# Patient Record
Sex: Female | Born: 1975 | Race: White | Hispanic: No | Marital: Single | Smoking: Never smoker
Health system: Southern US, Community
[De-identification: ages and names within clinical notes are randomized; demographics above are authoritative.]

## PROBLEM LIST (undated history)

## (undated) DIAGNOSIS — E282 Polycystic ovarian syndrome: Secondary | ICD-10-CM

## (undated) DIAGNOSIS — H832X9 Labyrinthine dysfunction, unspecified ear: Secondary | ICD-10-CM

## (undated) DIAGNOSIS — F32A Depression, unspecified: Secondary | ICD-10-CM

## (undated) DIAGNOSIS — J45909 Unspecified asthma, uncomplicated: Secondary | ICD-10-CM

## (undated) DIAGNOSIS — K219 Gastro-esophageal reflux disease without esophagitis: Secondary | ICD-10-CM

## (undated) DIAGNOSIS — L509 Urticaria, unspecified: Secondary | ICD-10-CM

## (undated) DIAGNOSIS — H8190 Unspecified disorder of vestibular function, unspecified ear: Secondary | ICD-10-CM

## (undated) DIAGNOSIS — D649 Anemia, unspecified: Secondary | ICD-10-CM

## (undated) DIAGNOSIS — F419 Anxiety disorder, unspecified: Secondary | ICD-10-CM

## (undated) DIAGNOSIS — L719 Rosacea, unspecified: Secondary | ICD-10-CM

## (undated) DIAGNOSIS — K8020 Calculus of gallbladder without cholecystitis without obstruction: Secondary | ICD-10-CM

## (undated) DIAGNOSIS — K648 Other hemorrhoids: Secondary | ICD-10-CM

## (undated) DIAGNOSIS — Z800 Family history of malignant neoplasm of digestive organs: Secondary | ICD-10-CM

## (undated) DIAGNOSIS — Z8719 Personal history of other diseases of the digestive system: Secondary | ICD-10-CM

## (undated) HISTORY — PX: UPPER GASTROINTESTINAL ENDOSCOPY: SHX188

## (undated) HISTORY — DX: Personal history of other diseases of the digestive system: Z87.19

## (undated) HISTORY — PX: COLONOSCOPY: SHX174

## (undated) HISTORY — PX: APPENDECTOMY: SHX54

## (undated) HISTORY — DX: Rosacea, unspecified: L71.9

## (undated) HISTORY — DX: Unspecified asthma, uncomplicated: J45.909

## (undated) HISTORY — DX: Unspecified disorder of vestibular function, unspecified ear: H81.90

## (undated) HISTORY — DX: Labyrinthine dysfunction, unspecified ear: H83.2X9

## (undated) HISTORY — PX: ESOPHAGOGASTRODUODENOSCOPY: SHX1529

## (undated) HISTORY — DX: Urticaria, unspecified: L50.9

## (undated) HISTORY — DX: Family history of malignant neoplasm of digestive organs: Z80.0

## (undated) HISTORY — DX: Other hemorrhoids: K64.8

## (undated) HISTORY — DX: Polycystic ovarian syndrome: E28.2

## (undated) HISTORY — DX: Gastro-esophageal reflux disease without esophagitis: K21.9

## (undated) HISTORY — PX: OTHER SURGICAL HISTORY: SHX169

## (undated) HISTORY — DX: Calculus of gallbladder without cholecystitis without obstruction: K80.20

---

## 1999-07-27 ENCOUNTER — Other Ambulatory Visit: Admission: RE | Admit: 1999-07-27 | Discharge: 1999-07-27 | Payer: Self-pay | Admitting: Obstetrics & Gynecology

## 2000-09-02 ENCOUNTER — Other Ambulatory Visit: Admission: RE | Admit: 2000-09-02 | Discharge: 2000-09-02 | Payer: Self-pay | Admitting: Obstetrics & Gynecology

## 2001-12-04 ENCOUNTER — Other Ambulatory Visit: Admission: RE | Admit: 2001-12-04 | Discharge: 2001-12-04 | Payer: Self-pay | Admitting: Obstetrics & Gynecology

## 2002-12-08 ENCOUNTER — Other Ambulatory Visit: Admission: RE | Admit: 2002-12-08 | Discharge: 2002-12-08 | Payer: Self-pay | Admitting: Gynecology

## 2003-01-18 ENCOUNTER — Emergency Department (HOSPITAL_COMMUNITY): Admission: EM | Admit: 2003-01-18 | Discharge: 2003-01-18 | Payer: Self-pay | Admitting: Emergency Medicine

## 2003-01-18 ENCOUNTER — Encounter: Payer: Self-pay | Admitting: Emergency Medicine

## 2003-12-03 ENCOUNTER — Ambulatory Visit (HOSPITAL_COMMUNITY): Admission: RE | Admit: 2003-12-03 | Discharge: 2003-12-03 | Payer: Self-pay | Admitting: Internal Medicine

## 2003-12-14 ENCOUNTER — Emergency Department (HOSPITAL_COMMUNITY): Admission: EM | Admit: 2003-12-14 | Discharge: 2003-12-15 | Payer: Self-pay | Admitting: Emergency Medicine

## 2003-12-20 ENCOUNTER — Encounter: Admission: RE | Admit: 2003-12-20 | Discharge: 2003-12-20 | Payer: Self-pay | Admitting: Internal Medicine

## 2003-12-31 ENCOUNTER — Other Ambulatory Visit: Admission: RE | Admit: 2003-12-31 | Discharge: 2003-12-31 | Payer: Self-pay | Admitting: Gynecology

## 2004-01-20 ENCOUNTER — Encounter (INDEPENDENT_AMBULATORY_CARE_PROVIDER_SITE_OTHER): Payer: Self-pay | Admitting: Gastroenterology

## 2004-01-20 DIAGNOSIS — K209 Esophagitis, unspecified without bleeding: Secondary | ICD-10-CM | POA: Insufficient documentation

## 2004-01-31 ENCOUNTER — Emergency Department (HOSPITAL_COMMUNITY): Admission: EM | Admit: 2004-01-31 | Discharge: 2004-01-31 | Payer: Self-pay | Admitting: Emergency Medicine

## 2004-12-27 ENCOUNTER — Other Ambulatory Visit: Admission: RE | Admit: 2004-12-27 | Discharge: 2004-12-27 | Payer: Self-pay | Admitting: Gynecology

## 2005-10-11 ENCOUNTER — Ambulatory Visit: Payer: Self-pay | Admitting: Family Medicine

## 2006-02-14 ENCOUNTER — Other Ambulatory Visit: Admission: RE | Admit: 2006-02-14 | Discharge: 2006-02-14 | Payer: Self-pay | Admitting: Gynecology

## 2006-05-29 ENCOUNTER — Ambulatory Visit: Payer: Self-pay | Admitting: Gastroenterology

## 2006-11-18 ENCOUNTER — Ambulatory Visit: Payer: Self-pay | Admitting: Family Medicine

## 2006-11-19 LAB — CONVERTED CEMR LAB
ALT: 8 units/L (ref 0–40)
AST: 17 units/L (ref 0–37)
Albumin: 4.1 g/dL (ref 3.5–5.2)
Alkaline Phosphatase: 64 units/L (ref 39–117)
Amylase: 63 units/L (ref 27–131)
BUN: 14 mg/dL (ref 6–23)
Basophils Absolute: 0.1 10*3/uL (ref 0.0–0.1)
Basophils Relative: 0.9 % (ref 0.0–1.0)
CO2: 30 meq/L (ref 19–32)
Calcium: 9.7 mg/dL (ref 8.4–10.5)
Chloride: 103 meq/L (ref 96–112)
Creatinine, Ser: 0.6 mg/dL (ref 0.4–1.2)
Eosinophils Relative: 6.2 % — ABNORMAL HIGH (ref 0.0–5.0)
GFR calc Af Amer: 151 mL/min
GFR calc non Af Amer: 125 mL/min
Glucose, Bld: 91 mg/dL (ref 70–99)
HCT: 38.8 % (ref 36.0–46.0)
Hemoglobin: 13 g/dL (ref 12.0–15.0)
Lymphocytes Relative: 25.1 % (ref 12.0–46.0)
MCHC: 33.6 g/dL (ref 30.0–36.0)
MCV: 89.4 fL (ref 78.0–100.0)
Monocytes Absolute: 0.5 10*3/uL (ref 0.2–0.7)
Monocytes Relative: 5.9 % (ref 3.0–11.0)
Neutro Abs: 5.4 10*3/uL (ref 1.4–7.7)
Neutrophils Relative %: 61.9 % (ref 43.0–77.0)
Platelets: 385 10*3/uL (ref 150–400)
Potassium: 3.8 meq/L (ref 3.5–5.1)
RBC: 4.33 M/uL (ref 3.87–5.11)
RDW: 12.2 % (ref 11.5–14.6)
Sodium: 139 meq/L (ref 135–145)
TSH: 1.45 microintl units/mL (ref 0.35–5.50)
Total Bilirubin: 0.9 mg/dL (ref 0.3–1.2)
Total Protein: 7 g/dL (ref 6.0–8.3)
WBC: 8.7 10*3/uL (ref 4.5–10.5)

## 2007-02-27 ENCOUNTER — Other Ambulatory Visit: Admission: RE | Admit: 2007-02-27 | Discharge: 2007-02-27 | Payer: Self-pay | Admitting: Gynecology

## 2007-04-03 ENCOUNTER — Ambulatory Visit: Payer: Self-pay | Admitting: Internal Medicine

## 2007-05-15 ENCOUNTER — Ambulatory Visit: Payer: Self-pay | Admitting: Internal Medicine

## 2007-05-15 ENCOUNTER — Encounter: Payer: Self-pay | Admitting: Internal Medicine

## 2007-05-15 DIAGNOSIS — K648 Other hemorrhoids: Secondary | ICD-10-CM | POA: Insufficient documentation

## 2007-10-08 ENCOUNTER — Ambulatory Visit: Payer: Self-pay | Admitting: Family Medicine

## 2007-10-08 DIAGNOSIS — J018 Other acute sinusitis: Secondary | ICD-10-CM | POA: Insufficient documentation

## 2007-12-09 ENCOUNTER — Ambulatory Visit: Payer: Self-pay | Admitting: Family Medicine

## 2007-12-09 DIAGNOSIS — J069 Acute upper respiratory infection, unspecified: Secondary | ICD-10-CM | POA: Insufficient documentation

## 2007-12-12 ENCOUNTER — Telehealth: Payer: Self-pay | Admitting: Family Medicine

## 2008-02-05 ENCOUNTER — Telehealth: Payer: Self-pay | Admitting: Family Medicine

## 2008-03-08 ENCOUNTER — Other Ambulatory Visit: Admission: RE | Admit: 2008-03-08 | Discharge: 2008-03-08 | Payer: Self-pay | Admitting: Gynecology

## 2008-04-14 DIAGNOSIS — Z8719 Personal history of other diseases of the digestive system: Secondary | ICD-10-CM | POA: Insufficient documentation

## 2008-04-14 DIAGNOSIS — K219 Gastro-esophageal reflux disease without esophagitis: Secondary | ICD-10-CM | POA: Insufficient documentation

## 2008-04-14 DIAGNOSIS — E282 Polycystic ovarian syndrome: Secondary | ICD-10-CM | POA: Insufficient documentation

## 2008-11-04 ENCOUNTER — Telehealth: Payer: Self-pay | Admitting: Family Medicine

## 2008-12-13 ENCOUNTER — Ambulatory Visit: Payer: Self-pay | Admitting: Family Medicine

## 2008-12-13 DIAGNOSIS — R42 Dizziness and giddiness: Secondary | ICD-10-CM | POA: Insufficient documentation

## 2008-12-15 ENCOUNTER — Telehealth: Payer: Self-pay | Admitting: Family Medicine

## 2009-01-25 ENCOUNTER — Encounter: Payer: Self-pay | Admitting: Family Medicine

## 2009-06-13 ENCOUNTER — Ambulatory Visit: Payer: Self-pay | Admitting: Family Medicine

## 2009-06-13 DIAGNOSIS — J309 Allergic rhinitis, unspecified: Secondary | ICD-10-CM | POA: Insufficient documentation

## 2010-06-30 ENCOUNTER — Ambulatory Visit: Admission: RE | Admit: 2010-06-30 | Discharge: 2010-06-30 | Payer: Self-pay | Admitting: Gynecology

## 2010-06-30 ENCOUNTER — Ambulatory Visit: Payer: Self-pay | Admitting: Vascular Surgery

## 2011-03-20 NOTE — Assessment & Plan Note (Signed)
[facility]                         GASTROENTEROLOGY OFFICE NOTE   Maria Duran, Maria Duran                      MRN:          [id number]  DATE:04/03/2007                            DOB:          [birth year]    CHIEF COMPLAINT:  Discuss colonoscopy.   HISTORY:  Maria Duran is a 35 year old white woman previously followed by Dr.  Victorino Duran. Her problems are:  1. Gastroesophageal reflux disease.  2. Irritable bowel syndrome with history of small ulcer in terminal      ileum on previous colonoscopy in 1998. Inflammatory bowel disease      serology and biopsies did not indicate inflammatory bowel disease.  3. Polycystic ovary syndrome.  4. Family history of colon cancer in a paternal grandfather in his 68s      and history of polyps in her father in his 16s.   INTERVAL HISTORY:  Around March or April she had a spell with some  diarrhea, cramping abdominal pain and she also had trouble with blood in  her stool. That seems to have resolved. As far as social history is  concerned, some time around that time, her husband told her that he did  not love her anymore and wanted a divorce. She seems to be dealing with  that reasonably well she tells me and does not think that necessarily  triggered this. Right now, she seems to be doing reasonably well, but  she is concerned about the passage of blood and the family history.   MEDICATIONS:  1. Aciphex 20 mg daily.  2. Zoloft 50 mg daily.   DRUG ALLERGIES:  SHE GOT A RASH WITH ERYTHROMYCIN.   PHYSICAL EXAMINATION:  Shows her to be in no acute distress. This is a  well-developed, well-nourished, overweight young white woman. Height is  5 feet, 6 inches, weight 189 pounds, pulse 64, blood pressure 116/70.  LUNGS:  Clear.  HEART: S1, S2. No murmurs, rubs or gallops.  ABDOMEN: Is soft. Nontender without organomegaly or mass.   ASSESSMENT:  History of blood in the stool, some change in her bowel  habits recently,  though she may certainly just have irritable bowel  syndrome and anorectal bleeding. There is a family history of colon  cancer and polyps so colorectal neoplasia and inflammatory bowel disease  are considerations given the overall history.   PLAN:  1. Schedule colonoscopy. If this is unrevealing,as far as her family      history issues will need to check into that, but it may be that she      should wait another ten years until she is about 40 to      have another colonoscopy I think. There are no other family members      with colon polyps or      cancer.  2. Further medication plans pending clinical course and these results.     Iva Boop, MD,FACG  Electronically Signed    CEG/MedQ  DD: 04/03/2007  DT: 04/03/2007  Job #: [id number]

## 2011-03-23 NOTE — Assessment & Plan Note (Signed)
Maria Duran                           GASTROENTEROLOGY OFFICE NOTE   Maria Duran, Maria Duran                      MRN:          [id number]  DATE:05/29/2006                            DOB:          01-25-1976    PROBLEM:  Chest burning.   HISTORY:  Maria Duran is a pleasant 35 year old white female known to Dr. Victorino Dike, who has not been seen in approximately two years.  She had been  seen in the past for reflux symptoms and also has family history of colon  cancer.  She had undergone prior endoscopy in 2005 which showed a 2 cm  hiatal hernia and mild distal esophagitis.  There was some difficulty  handling air insufflation noted.  She also had prior colonoscopy done in  1998.  There was one small ulcer in the terminal ileum noted. Question or  rule out IBD.  Her biopsies showed mucosal erosion with acute inflammation,  non-specific.  Patient had been on PPI therapy in the past but says she has  not been on any medication over the past two years.  She has noticed over  the past couple of months that she has had recurrent symptoms with  intermittent burning in her chest.  She also complains of a lot of burping  and belching and a frequent need to clear her throat.  She has been using  Rolaids p.r.n. which she does find helpful, but does not control her  symptoms.  She denies any nocturnal symptoms currently.  Says that her chest  discomfort is worse postprandially.  She has not had any vomiting, denies  any abdominal pain.  Her bowel habits have been normal.   She denies any problems with abdominal cramping, diarrhea or rectal  bleeding.   CURRENT MEDICATIONS:  Zoloft 50 q. day.   ALLERGIES:  ERYTHROMYCIN which causes a rash.   FAMILY HISTORY:  Pertinent for paternal grandfather with colon cancer  diagnosed in his late 15s.  Father with history of colon polyps, initially  diagnosed in his 82s.  She has a maternal grandfather with history of  Crohn's.   EXAMINATION:  GENERAL:  Well developed white female in no acute distress.  VITAL SIGNS:  Blood pressure 118/64.  Pulse is 60.  Weight is 188.4.  CARDIOVASCULAR:  Regular rate and rhythm with S1 and S2.  PULMONARY:  Clear to auscultation and percussion.  ABDOMEN:  Soft, bowel sounds are active.  She is non-tender.   IMPRESSION:  1.  GERD with recurrent symptoms.  2.  Positive family history of colon cancer in a grandfather less than 31      years of age.   PLAN:  1.  Start Nexium 40 mg q. a.m.  2.  Schedule colonoscopy due to family history at her convenience.                                   Maria Esterwood, PA-C  Ulyess Mort, MD   AE/MedQ  DD:  05/31/2006  DT:  05/31/2006  Job #:  [id number]

## 2011-04-03 ENCOUNTER — Encounter: Payer: Self-pay | Admitting: *Deleted

## 2011-04-03 ENCOUNTER — Encounter: Payer: Self-pay | Admitting: Family Medicine

## 2011-04-03 ENCOUNTER — Ambulatory Visit (INDEPENDENT_AMBULATORY_CARE_PROVIDER_SITE_OTHER): Payer: 59 | Admitting: Family Medicine

## 2011-04-03 VITALS — BP 104/76 | HR 73 | Temp 98.7°F | Resp 14 | Wt 157.0 lb

## 2011-04-03 DIAGNOSIS — R600 Localized edema: Secondary | ICD-10-CM

## 2011-04-03 DIAGNOSIS — R609 Edema, unspecified: Secondary | ICD-10-CM

## 2011-04-03 DIAGNOSIS — E785 Hyperlipidemia, unspecified: Secondary | ICD-10-CM

## 2011-04-03 LAB — BASIC METABOLIC PANEL
Calcium: 9.4 mg/dL (ref 8.4–10.5)
GFR: 99.38 mL/min (ref >60.00)
Potassium: 4.6 mEq/L (ref 3.5–5.1)
Sodium: 142 mEq/L (ref 135–145)

## 2011-04-03 LAB — CBC WITH DIFFERENTIAL/PLATELET
Basophils Absolute: 0.1 10*3/uL (ref 0.0–0.1)
Eosinophils Relative: 6.5 % — ABNORMAL HIGH (ref 0.0–5.0)
HCT: 36.3 % (ref 36.0–46.0)
Lymphs Abs: 1.7 10*3/uL (ref 0.7–4.0)
Monocytes Absolute: 0.5 10*3/uL (ref 0.1–1.0)
Monocytes Relative: 7 % (ref 3.0–12.0)
Neutrophils Relative %: 60.7 % (ref 43.0–77.0)
Platelets: 294 10*3/uL (ref 150.0–400.0)
RDW: 12.6 % (ref 11.5–14.6)
WBC: 6.8 10*3/uL (ref 4.5–10.5)

## 2011-04-03 LAB — LIPID PANEL
Cholesterol: 186 mg/dL (ref 0–200)
Triglycerides: 65 mg/dL (ref 0.0–149.0)

## 2011-04-03 LAB — HEPATIC FUNCTION PANEL
ALT: 7 U/L (ref 0–35)
AST: 14 U/L (ref 0–37)
Total Bilirubin: 0.7 mg/dL (ref 0.3–1.2)
Total Protein: 6.7 g/dL (ref 6.0–8.3)

## 2011-04-03 LAB — TSH: TSH: 1.02 u[IU]/mL (ref 0.35–5.50)

## 2011-04-03 MED ORDER — LORAZEPAM 0.5 MG PO TABS: 0.5000 mg | ORAL_TABLET | Freq: Four times a day (QID) | ORAL | Status: AC | PRN

## 2011-04-03 NOTE — Progress Notes (Signed)
Addended by: Bonnye Fava on: 04/03/2011 09:20 AM   Modules accepted: Orders

## 2011-04-03 NOTE — Progress Notes (Signed)
  Subjective:    Patient ID: Maria Duran, female    DOB: 30-May-1976, 35 y.o.   MRN: [id number]  HPI Here for one year of unilateral swelling in the left leg from the foot up to the thigh. This is intermittent, and it gets worse during the day and goes down at night. No swelling in the right leg or hands. No SOB. The leg may feel tight but is not painful. She was started on BCP about a year ago by Dr. Chevis Pretty, and this started a month or 2 after that. Dr. Chevis Pretty ordered a left leg venous doppler on 06-30-10, which was normal.    Review of Systems  Constitutional: Negative.   Respiratory: Negative.   Cardiovascular: Positive for leg swelling. Negative for chest pain and palpitations.  Gastrointestinal: Negative.        Objective:   Physical Exam  Constitutional: She appears well-developed and well-nourished. No distress.  Neck: No thyromegaly present.  Cardiovascular: Normal rate, regular rhythm, normal heart sounds and intact distal pulses.   Pulmonary/Chest: Effort normal and breath sounds normal.  Abdominal: Soft. Bowel sounds are normal. She exhibits no distension and no mass. There is no tenderness. There is no rebound and no guarding.       No inguinal adenopathy   Musculoskeletal: Normal range of motion. She exhibits edema. She exhibits no tenderness.       She has 2+ edema in the left foot up to the mid shin. No cords are felt, no tenderness, negative Homans  Lymphadenopathy:    She has no cervical adenopathy.  Skin: Skin is warm and dry. No erythema.          Assessment & Plan:  Unilateral leg swelling. We need to rule out a DVT again but also look for any other source of venous blockade. Get a doppler and also a CT of the pelvis to rule out extrinsic sources of pelvic vein obstructions. Walk frequently, elevate the leg.

## 2011-04-04 NOTE — Progress Notes (Signed)
Quick Note:  LMTCB ______ 

## 2011-04-04 NOTE — Progress Notes (Signed)
Quick Note:  Pt given normal results. ______ 

## 2011-04-09 ENCOUNTER — Other Ambulatory Visit: Payer: Self-pay | Admitting: Cardiology

## 2011-04-09 DIAGNOSIS — R609 Edema, unspecified: Secondary | ICD-10-CM

## 2011-04-10 ENCOUNTER — Other Ambulatory Visit: Payer: Self-pay | Admitting: *Deleted

## 2011-04-10 ENCOUNTER — Ambulatory Visit (INDEPENDENT_AMBULATORY_CARE_PROVIDER_SITE_OTHER)
Admission: RE | Admit: 2011-04-10 | Discharge: 2011-04-10 | Disposition: A | Discharge status: Home / Self Care | Payer: 59 | Source: Ambulatory Visit | Attending: Family Medicine | Admitting: Family Medicine

## 2011-04-10 ENCOUNTER — Telehealth: Payer: Self-pay | Admitting: *Deleted

## 2011-04-10 ENCOUNTER — Encounter (INDEPENDENT_AMBULATORY_CARE_PROVIDER_SITE_OTHER): Payer: 59 | Admitting: *Deleted

## 2011-04-10 DIAGNOSIS — R600 Localized edema: Secondary | ICD-10-CM

## 2011-04-10 DIAGNOSIS — R609 Edema, unspecified: Secondary | ICD-10-CM

## 2011-04-10 MED ORDER — IOHEXOL 300 MG/ML  SOLN
100.0000 mL | Freq: Once | INTRAMUSCULAR | Status: AC | PRN
  Administered 2011-04-10: 100 mL via INTRAVENOUS

## 2011-04-10 NOTE — Telephone Encounter (Signed)
Pt aware.

## 2011-04-10 NOTE — Telephone Encounter (Signed)
Maxwell Caul from the Vascular Lab called to inform you that the Venous Lower Leg that was ordered for Maria Duran showed a small, superficial clot at about 4cm above the ankle that is not fresh in origin; appears to have been there awhile; cannot see any further on scan.

## 2011-04-10 NOTE — Telephone Encounter (Signed)
Tell her that the [place] was clear except for a tiny old clot in a superficial vein. This is not dangerous and has nothing to do with the swelling in her leg. We await the CT results

## 2011-04-13 ENCOUNTER — Encounter: Payer: Self-pay | Admitting: Family Medicine

## 2011-06-26 ENCOUNTER — Telehealth: Payer: Self-pay | Admitting: *Deleted

## 2011-06-26 NOTE — Telephone Encounter (Signed)
Requesting copy of doppler and ct--her number when ready  [phone]

## 2011-06-28 NOTE — Telephone Encounter (Signed)
I spoke with pt and will put a copy of CT in mail today. I do not have a copy of the doppler.

## 2011-07-27 ENCOUNTER — Other Ambulatory Visit: Payer: Self-pay | Admitting: Gynecology

## 2012-07-15 ENCOUNTER — Other Ambulatory Visit (INDEPENDENT_AMBULATORY_CARE_PROVIDER_SITE_OTHER): Payer: 59

## 2012-07-15 DIAGNOSIS — Z0000 Encounter for general adult medical examination without abnormal findings: Secondary | ICD-10-CM

## 2012-07-15 LAB — CBC WITH DIFFERENTIAL/PLATELET
Eosinophils Relative: 6 % — ABNORMAL HIGH (ref 0.0–5.0)
HCT: 39.8 % (ref 36.0–46.0)
Hemoglobin: 13.4 g/dL (ref 12.0–15.0)
Lymphs Abs: 1.9 10*3/uL (ref 0.7–4.0)
Monocytes Relative: 7.1 % (ref 3.0–12.0)
Neutro Abs: 3.8 10*3/uL (ref 1.4–7.7)
RBC: 4.42 Mil/uL (ref 3.87–5.11)
WBC: 6.6 10*3/uL (ref 4.5–10.5)

## 2012-07-15 LAB — HEPATIC FUNCTION PANEL
ALT: 17 U/L (ref 0–35)
AST: 18 U/L (ref 0–37)
Albumin: 4 g/dL (ref 3.5–5.2)
Alkaline Phosphatase: 46 U/L (ref 39–117)

## 2012-07-15 LAB — LIPID PANEL
HDL: 57.8 mg/dL (ref >39.00)
LDL Cholesterol: 127 mg/dL — ABNORMAL HIGH (ref 0–99)

## 2012-07-15 LAB — BASIC METABOLIC PANEL
GFR: 138.26 mL/min (ref >60.00)
Glucose, Bld: 85 mg/dL (ref 70–99)
Potassium: 4.3 mEq/L (ref 3.5–5.1)
Sodium: 140 mEq/L (ref 135–145)

## 2012-07-15 LAB — POCT URINALYSIS DIPSTICK
Bilirubin, UA: NEGATIVE
Blood, UA: NEGATIVE
Nitrite, UA: NEGATIVE
Spec Grav, UA: 1.025
Urobilinogen, UA: 0.2
pH, UA: 6

## 2012-07-15 LAB — TSH: TSH: 0.79 u[IU]/mL (ref 0.35–5.50)

## 2012-07-18 NOTE — Progress Notes (Signed)
Quick Note:  I left voice message with results. ______ 

## 2012-07-21 ENCOUNTER — Ambulatory Visit (INDEPENDENT_AMBULATORY_CARE_PROVIDER_SITE_OTHER): Payer: 59 | Admitting: Family Medicine

## 2012-07-21 ENCOUNTER — Encounter: Payer: Self-pay | Admitting: Family Medicine

## 2012-07-21 VITALS — BP 106/68 | HR 71 | Temp 98.8°F | Ht 66.25 in | Wt 168.0 lb

## 2012-07-21 DIAGNOSIS — I8390 Asymptomatic varicose veins of unspecified lower extremity: Secondary | ICD-10-CM

## 2012-07-21 DIAGNOSIS — Z0000 Encounter for general adult medical examination without abnormal findings: Secondary | ICD-10-CM

## 2012-07-21 NOTE — Progress Notes (Signed)
  Subjective:    Patient ID: Maria Duran, female    DOB: 09/24/1976, 36 y.o.   MRN: [id number]  HPI 36 yr old female for a cpx. She feels well except for chronic pain and swelling in the legs. She has known venous disease but has never seen a vein specialist. She asks to do so now.    Review of Systems  Constitutional: Negative.   HENT: Negative.   Eyes: Negative.   Respiratory: Negative.   Cardiovascular: Negative.   Gastrointestinal: Negative.   Genitourinary: Negative for dysuria, urgency, frequency, hematuria, flank pain, decreased urine volume, enuresis, difficulty urinating, pelvic pain and dyspareunia.  Musculoskeletal: Negative.   Skin: Negative.   Neurological: Negative.   Hematological: Negative.   Psychiatric/Behavioral: Negative.        Objective:   Physical Exam  Constitutional: She is oriented to person, place, and time. She appears well-developed and well-nourished. No distress.  HENT:  Head: Normocephalic and atraumatic.  Right Ear: External ear normal.  Left Ear: External ear normal.  Nose: Nose normal.  Mouth/Throat: Oropharynx is clear and moist. No oropharyngeal exudate.  Eyes: Conjunctivae normal and EOM are normal. Pupils are equal, round, and reactive to light. No scleral icterus.  Neck: Normal range of motion. Neck supple. No JVD present. No thyromegaly present.  Cardiovascular: Normal rate, regular rhythm, normal heart sounds and intact distal pulses.  Exam reveals no gallop and no friction rub.   No murmur heard. Pulmonary/Chest: Effort normal and breath sounds normal. No respiratory distress. She has no wheezes. She has no rales. She exhibits no tenderness.  Abdominal: Soft. Bowel sounds are normal. She exhibits no distension and no mass. There is no tenderness. There is no rebound and no guarding.  Musculoskeletal: Normal range of motion. She exhibits no edema and no tenderness.  Lymphadenopathy:    She has no cervical adenopathy.  Neurological:  She is alert and oriented to person, place, and time. She has normal reflexes. No cranial nerve deficit. She exhibits normal muscle tone. Coordination normal.  Skin: Skin is warm and dry. No rash noted. No erythema.  Psychiatric: She has a normal mood and affect. Her behavior is normal. Judgment and thought content normal.          Assessment & Plan:  Well exam. Refer to the [facility].

## 2012-09-24 ENCOUNTER — Ambulatory Visit (INDEPENDENT_AMBULATORY_CARE_PROVIDER_SITE_OTHER): Payer: 59 | Admitting: Family Medicine

## 2012-09-24 ENCOUNTER — Encounter: Payer: Self-pay | Admitting: Family Medicine

## 2012-09-24 VITALS — BP 108/74 | HR 73 | Temp 98.4°F | Wt 174.0 lb

## 2012-09-24 DIAGNOSIS — F329 Major depressive disorder, single episode, unspecified: Secondary | ICD-10-CM

## 2012-09-24 DIAGNOSIS — F341 Dysthymic disorder: Secondary | ICD-10-CM

## 2012-09-24 DIAGNOSIS — F32A Depression, unspecified: Secondary | ICD-10-CM

## 2012-09-24 DIAGNOSIS — F419 Anxiety disorder, unspecified: Secondary | ICD-10-CM

## 2012-09-24 MED ORDER — SERTRALINE HCL 50 MG PO TABS: 50.0000 mg | ORAL_TABLET | Freq: Every day | ORAL | Status: DC

## 2012-09-24 NOTE — Progress Notes (Signed)
  Subjective:    Patient ID: Maria Duran, female    DOB: 1976-08-04, 36 y.o.   MRN: [id number]  HPI Here for help with stress. She took Zoloft until about 2 years ago, and this worked well for her. She did well for awhile but lately she has been under a lot of stress. 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 She feels overwhelmed, anxious, and sad. She finds it hard to focus on her work.    Review of Systems  Constitutional: Negative.   Psychiatric/Behavioral: Positive for dysphoric mood and decreased concentration. Negative for hallucinations, confusion and agitation. The patient is nervous/anxious.        Objective:   Physical Exam  Constitutional: She is oriented to person, place, and time. She appears well-developed and well-nourished.  Neurological: She is alert and oriented to person, place, and time.  Psychiatric: She has a normal mood and affect. Her behavior is normal. Thought content normal.          Assessment & Plan:  Get back on Zoloft. Consider getting into therapy. Recheck in one month

## 2012-09-29 ENCOUNTER — Telehealth: Payer: Self-pay | Admitting: Family Medicine

## 2012-09-29 NOTE — Telephone Encounter (Signed)
Patient states that she was recently seen in office for anxiety and dizziness.  Patient called another provider (Sera Gerilyn Pilgrim - with [facility]) that provides script for Klonopin 0.5 mg 3 times a day as needed that helped with dizziness.  The provider is no longer in practice so patient was unable to get refill.  Patient would like to know if this medication can be managed by Dr. Clent Ridges or if a referral can be given for follow up with [facility] nose [facility] for management.

## 2012-09-29 NOTE — Telephone Encounter (Signed)
Call in Clonazepam 0.5 mg to take q 8 hours prn anxiety, #60 with 2 rf. Take this along with the Zoloft she recently started

## 2012-09-30 MED ORDER — CLONAZEPAM 0.5 MG PO TABS: 0.5000 mg | ORAL_TABLET | Freq: Three times a day (TID) | ORAL | Status: DC | PRN

## 2012-09-30 NOTE — Telephone Encounter (Signed)
I called in script and spoke with pt. 

## 2012-11-06 ENCOUNTER — Other Ambulatory Visit: Payer: Self-pay | Admitting: Family Medicine

## 2012-11-07 ENCOUNTER — Telehealth: Payer: Self-pay | Admitting: Family Medicine

## 2012-11-07 NOTE — Telephone Encounter (Signed)
Refill request for Lorazepam 0.5 mg take 1 po q6hrs prn and last here 09/24/12.

## 2012-11-10 NOTE — Telephone Encounter (Signed)
She is already on Clonazepam, so we cannot give her Lorazepam at the same time

## 2012-11-11 NOTE — Telephone Encounter (Signed)
I tried to reach pt by phone, no answer. 

## 2012-12-20 ENCOUNTER — Other Ambulatory Visit: Payer: Self-pay

## 2012-12-21 ENCOUNTER — Other Ambulatory Visit: Payer: Self-pay | Admitting: Family Medicine

## 2012-12-22 ENCOUNTER — Other Ambulatory Visit: Payer: Self-pay | Admitting: Family Medicine

## 2013-02-10 ENCOUNTER — Other Ambulatory Visit: Payer: Self-pay | Admitting: Family Medicine

## 2013-02-10 NOTE — Telephone Encounter (Signed)
I called in script 

## 2013-02-10 NOTE — Telephone Encounter (Signed)
Call in #60 with 5 rf 

## 2013-03-20 ENCOUNTER — Other Ambulatory Visit: Payer: Self-pay | Admitting: Family Medicine

## 2013-06-29 ENCOUNTER — Other Ambulatory Visit: Payer: Self-pay | Admitting: Family Medicine

## 2013-06-30 NOTE — Telephone Encounter (Signed)
Okay for one year  

## 2013-09-10 ENCOUNTER — Other Ambulatory Visit: Payer: Self-pay

## 2013-11-08 ENCOUNTER — Other Ambulatory Visit: Payer: Self-pay | Admitting: Family Medicine

## 2013-11-09 NOTE — Telephone Encounter (Signed)
NO refills without an OV  

## 2013-12-16 ENCOUNTER — Encounter: Payer: Self-pay | Admitting: Family Medicine

## 2013-12-16 ENCOUNTER — Ambulatory Visit (INDEPENDENT_AMBULATORY_CARE_PROVIDER_SITE_OTHER): Payer: BC Managed Care – PPO | Admitting: Family Medicine

## 2013-12-16 VITALS — BP 110/70 | Temp 99.7°F | Ht 66.25 in | Wt 171.0 lb

## 2013-12-16 DIAGNOSIS — F411 Generalized anxiety disorder: Secondary | ICD-10-CM

## 2013-12-16 DIAGNOSIS — J019 Acute sinusitis, unspecified: Secondary | ICD-10-CM

## 2013-12-16 MED ORDER — FLUCONAZOLE 150 MG PO TABS: 150.0000 mg | ORAL_TABLET | Freq: Once | ORAL | Status: DC | PRN

## 2013-12-16 MED ORDER — AMOXICILLIN-POT CLAVULANATE 875-125 MG PO TABS: 1.0000 | ORAL_TABLET | Freq: Two times a day (BID) | ORAL | Status: DC

## 2013-12-16 MED ORDER — HYDROCODONE-HOMATROPINE 5-1.5 MG/5ML PO SYRP: 5.0000 mL | ORAL_SOLUTION | ORAL | Status: DC | PRN

## 2013-12-16 MED ORDER — CLONAZEPAM 0.5 MG PO TABS: ORAL_TABLET | ORAL | Status: DC

## 2013-12-16 NOTE — Progress Notes (Signed)
Pre visit review using our clinic review tool, if applicable. No additional management support is needed unless otherwise documented below in the visit note. 

## 2013-12-16 NOTE — Progress Notes (Signed)
   Subjective:    Patient ID: Maria Duran, female    DOB: 03/04/1976, 38 y.o.   MRN: [id number]  HPI Here for 5 days of fever, sinus pressure, ST and a dry cough. Using Nyquil and Robitussin DM.    Review of Systems  Constitutional: Positive for fever.  HENT: Positive for congestion, postnasal drip and sinus pressure.   Respiratory: Positive for cough.        Objective:   Physical Exam  Constitutional: She appears well-developed and well-nourished.  HENT:  Right Ear: External ear normal.  Nose: Nose normal.  Mouth/Throat: Oropharynx is clear and moist.  Eyes: Conjunctivae are normal.  Pulmonary/Chest: Effort normal and breath sounds normal.  Lymphadenopathy:    She has no cervical adenopathy.          Assessment & Plan:  Add Mucinex.

## 2014-01-11 ENCOUNTER — Other Ambulatory Visit: Payer: Self-pay | Admitting: Gynecology

## 2014-07-26 ENCOUNTER — Other Ambulatory Visit: Payer: Self-pay | Admitting: Family Medicine

## 2014-07-27 NOTE — Telephone Encounter (Signed)
Call in Clonazepam #60 with 5 rf, also refill Zoloft for one year

## 2014-08-20 ENCOUNTER — Other Ambulatory Visit: Payer: Self-pay

## 2015-01-31 ENCOUNTER — Other Ambulatory Visit: Payer: Self-pay | Admitting: Gynecology

## 2015-02-01 LAB — CYTOLOGY - PAP

## 2015-02-10 ENCOUNTER — Other Ambulatory Visit: Payer: Self-pay | Admitting: Gynecology

## 2015-03-17 ENCOUNTER — Other Ambulatory Visit: Payer: Self-pay | Admitting: Family Medicine

## 2015-03-17 NOTE — Telephone Encounter (Signed)
Call in #60 with no rf. She needs an OV soon

## 2015-05-06 ENCOUNTER — Encounter: Payer: Self-pay | Admitting: Family Medicine

## 2015-05-06 ENCOUNTER — Ambulatory Visit (INDEPENDENT_AMBULATORY_CARE_PROVIDER_SITE_OTHER): Payer: 59 | Admitting: Family Medicine

## 2015-05-06 VITALS — BP 108/70 | HR 72 | Temp 98.6°F | Ht 66.25 in | Wt 179.0 lb

## 2015-05-06 DIAGNOSIS — F411 Generalized anxiety disorder: Secondary | ICD-10-CM

## 2015-05-06 DIAGNOSIS — H832X9 Labyrinthine dysfunction, unspecified ear: Secondary | ICD-10-CM | POA: Diagnosis not present

## 2015-05-06 MED ORDER — SERTRALINE HCL 50 MG PO TABS: 50.0000 mg | ORAL_TABLET | Freq: Every day | ORAL | Status: DC

## 2015-05-06 MED ORDER — AMPICILLIN 500 MG PO CAPS: 500.0000 mg | ORAL_CAPSULE | Freq: Every day | ORAL | Status: DC

## 2015-05-06 MED ORDER — CLONAZEPAM 0.5 MG PO TABS: ORAL_TABLET | ORAL | Status: DC

## 2015-05-06 NOTE — Progress Notes (Signed)
Pre visit review using our clinic review tool, if applicable. No additional management support is needed unless otherwise documented below in the visit note. 

## 2015-05-06 NOTE — Progress Notes (Signed)
   Subjective:    Patient ID: Maria Duran, female    DOB: 08/19/1976, 39 y.o.   MRN: [id number]  HPI Here to follow up on anxiety and for intermittent vestibular dysfunction. She has Mal de Debarquement syndrome which causes her to feel like she is floating or moving around. She as tried Meclizine in the past with no benefit. Her anxiety is stable.   Review of Systems  Constitutional: Negative.   HENT: Negative.   Eyes: Negative.   Respiratory: Negative.   Neurological: Positive for dizziness. Negative for tremors, seizures, syncope, facial asymmetry, speech difficulty, weakness, light-headedness, numbness and headaches.  Psychiatric/Behavioral: Negative.        Objective:   Physical Exam  Constitutional: She is oriented to person, place, and time. She appears well-developed and well-nourished.  HENT:  Head: Normocephalic and atraumatic.  Right Ear: External ear normal.  Left Ear: External ear normal.  Nose: Nose normal.  Mouth/Throat: Oropharynx is clear and moist.  Eyes: Conjunctivae and EOM are normal. Pupils are equal, round, and reactive to light.  Neck: Neck supple. No thyromegaly present.  Cardiovascular: Normal rate, regular rhythm, normal heart sounds and intact distal pulses.   Pulmonary/Chest: Effort normal and breath sounds normal.  Lymphadenopathy:    She has no cervical adenopathy.  Neurological: She is alert and oriented to person, place, and time. No cranial nerve deficit. Coordination normal.  Psychiatric: She has a normal mood and affect. Her behavior is normal. Thought content normal.          Assessment & Plan:  Her anxiety is stable and we refilled the Clonazepam. For the vestibulitis we will refer her for vestibular rehab.

## 2015-07-18 ENCOUNTER — Ambulatory Visit: Payer: 59 | Attending: Family Medicine | Admitting: Rehabilitative and Restorative Service Providers"

## 2015-07-18 DIAGNOSIS — H8111 Benign paroxysmal vertigo, right ear: Secondary | ICD-10-CM

## 2015-07-18 DIAGNOSIS — R269 Unspecified abnormalities of gait and mobility: Secondary | ICD-10-CM | POA: Insufficient documentation

## 2015-07-18 NOTE — Therapy (Signed)
[facility] [address] [place], [place], [address] Phone: [phone]   Fax:  [phone]  Physical Therapy Evaluation  Patient Details  Name: Maria Duran MRN: [id number] Date of Birth: [birth year] Referring Provider:  Nelwyn Salisbury, MD  Encounter Date: 07/18/2015      PT End of Session - 07/18/15 0840    Visit Number 1   Number of Visits 4   Date for PT Re-Evaluation 08/17/15   Authorization Type private insurance (PT 23 visit limit)   PT Start Time 0800   PT Stop Time 0840   PT Time Calculation (min) 40 min   Activity Tolerance Patient tolerated treatment well   Behavior During Therapy [facility] for tasks assessed/performed      Past Medical History  Diagnosis Date  . Polycystic ovaries   . Personal history of other diseases of digestive system   . Esophageal reflux   . Internal hemorrhoids without mention of complication   . Family history of malignant neoplasm of gastrointestinal tract   . Vestibular dysfunction     Mal de Debarquement syndrome     Past Surgical History  Procedure Laterality Date  . None reported      There were no vitals filed for this visit.  Visit Diagnosis:  BPPV (benign paroxysmal positional vertigo), right  Abnormality of gait      Subjective Assessment - 07/18/15 0802    Subjective The patient reports that she has read information on mal de barquement syndrome and feels her symptoms are consistent with this syndrome.  She reports initial onset of symptoms 6+years ago after a major depressive syndrome in which she was treated with zoloft.  She soon after flew on an airplane and noted worsening symptoms after travel.  Her symtpoms are intermittent in nature, however the sensation will stay with her for months at a time and then gradually improve.    This most recent episode began in May and has remained constant every day since that time.   Pertinent History h/o migraines in the  past (no current issues per report), denies hearing changes, tinnitus, or visual changes.    Currently in Pain? No/denies            [facility] PT Assessment - 07/18/15 0810    Assessment   Medical Diagnosis dizziness/ vestibular issues   Prior Therapy none   Precautions   Precautions --  staggers at time with walking   Balance Screen   Has the patient fallen in the past 6 months No  stumbles and catches self   Has the patient had a decrease in activity level because of a fear of falling?  No   Is the patient reluctant to leave their home because of a fear of falling?  No   Home Tourist information centre manager residence   Prior Function   Level of Independence Independent   Vocation Full time employment  assistant   Vocation Requirements --  sitting/standing            Vestibular Assessment - 07/18/15 0812    Symptom Behavior   Type of Dizziness "Funny feeling in head"  floating feeling, bouncing sensation   Frequency of Dizziness --  daily/constant   Duration of Dizziness --  constant   Aggravating Factors --  sitting still   Relieving Factors --  driving, movement   Occulomotor Exam   Occulomotor Alignment --  R eye inferior to L eye per obs (  mild)   Spontaneous Absent   Gaze-induced Absent   Smooth Pursuits Intact   Saccades Intact   Vestibulo-Occular Reflex   VOR 1 Head Only (x 1 viewing) --  10 reps provokes 4/10 symtpoms in vertical and horiz plane   Positional Testing   Dix-Hallpike Dix-Hallpike Right;Dix-Hallpike Left   Sidelying Test Sidelying Right;Sidelying Left   Horizontal Canal Testing Horizontal Canal Right;Horizontal Canal Left   Sidelying Right   Sidelying Right Symptoms No nystagmus   Sidelying Left   Sidelying Left Duration 18 sec   Sidelying Left Symptoms Upbeat, left rotatory nystagmus   Horizontal Canal Right   Horizontal Canal Right Symptoms Normal   Horizontal Canal Left   Horizontal Canal Left Symptoms Normal                 Vestibular Treatment/Exercise - 07/18/15 0001    Vestibular Treatment/Exercise   Vestibular Treatment Provided Canalith Repositioning   Canalith Repositioning Epley Manuever Left    EPLEY MANUEVER LEFT   Number of Reps  1   Overall Response  Improved Symptoms    RESPONSE DETAILS LEFT --  The patient had full resolution of nystagmus after tx               PT Education - 07/18/15 0839    Education provided Yes   Education Details nature of BPPV, handout from [email or web address] on BPPV   Person(s) Educated Patient   Methods Explanation;Demonstration;Handout   Comprehension Verbalized understanding;Returned demonstration             PT Long Term Goals - 07/18/15 0841    PT LONG TERM GOAL #1   Title The patient will be indep with HEP for self mgmt of BPPV.   Baseline Target date 08/17/2015   Time 4   Period Weeks   PT LONG TERM GOAL #2   Title The patient will have negative L sidelying test indicating resolution of BPPV.   Baseline Target date 08/17/2015   Time 4   Period Weeks   PT LONG TERM GOAL #3   Title The patient will reduce dizziness handicap index by 15% (provided via e-mail at end of last session).   Baseline Target date 08/17/2015   Time 4   Period Weeks   PT LONG TERM GOAL #4   Title The patient will verbalize understanding of general wellness program.   Baseline Target date 08/17/2015   Time 4   Period Weeks               Plan - 07/18/15 0846    Clinical Impression Statement The patient is a 39 yo female presenting with h/o intermittent vertigo x years.  Her subjective reports were more consistent with general/vague sensations of dizziness, however when PT testing to rule out positional symptoms, she was positive for L BPPV.  She did note spinning in the provoking position.  The vague nature of subjective reporting may be due to patient's medications supressing some of her symptoms.  Pt treated for L BPPV (posterior  canalithiasis) and will determine at next visit if that improves her sensation of general dizziness.   Pt will benefit from skilled therapeutic intervention in order to improve on the following deficits Abnormal gait;Decreased mobility;Impaired sensation;Dizziness;Decreased balance   Rehab Potential Good   PT Frequency 1x / week   PT Duration 4 weeks   PT Treatment/Interventions Canalith Repostioning;Vestibular;Gait training;Functional mobility training;Patient/family education;Neuromuscular re-education   PT Next Visit Plan Check L BPPV, progress HEP, teach self treatment  for self mgmt of symptoms due to intermittent nature   Consulted and Agree with Plan of Care Patient         Problem List Patient Active Problem List   Diagnosis Date Noted  . Vestibular dysfunction 05/06/2015  . Anxiety state 12/16/2013  . ALLERGIC RHINITIS 06/13/2009  . VERTIGO 12/13/2008  . POLYCYSTIC OVARIAN DISEASE 04/14/2008  . GERD 04/14/2008  . IRRITABLE BOWEL SYNDROME, HX OF 04/14/2008  . VIRAL URI 12/09/2007  . OTHER ACUTE SINUSITIS 10/08/2007  . HEMORRHOIDS, INTERNAL 05/15/2007  . ESOPHAGITIS 01/20/2004    Thank you for the referral of this patient. Margretta Ditty, MPT  Beulah Capobianco, PT 07/18/2015, 2:24 PM  [facility] [address] [place], [place], [address] Phone: [phone]   Fax:  [phone]

## 2015-07-27 ENCOUNTER — Ambulatory Visit: Payer: 59 | Admitting: Rehabilitative and Restorative Service Providers"

## 2015-07-27 DIAGNOSIS — H8111 Benign paroxysmal vertigo, right ear: Secondary | ICD-10-CM

## 2015-07-27 DIAGNOSIS — R269 Unspecified abnormalities of gait and mobility: Secondary | ICD-10-CM

## 2015-07-27 NOTE — Therapy (Signed)
[facility] [address] [place], [place], [address] Phone: [phone]   Fax:  [phone]  Physical Therapy Treatment  Patient Details  Name: Maria Duran MRN: [id number] Date of Birth: [birth year] Referring Jaydyn Menon:  Nelwyn Salisbury, MD  Encounter Date: 07/27/2015      PT End of Session - 07/27/15 1145    Visit Number 2   Number of Visits 4   Date for PT Re-Evaluation 08/17/15   Authorization Type private insurance (PT 23 visit limit)   PT Start Time 1145   PT Stop Time 1230   PT Time Calculation (min) 45 min   Activity Tolerance Patient tolerated treatment well   Behavior During Therapy [facility] for tasks assessed/performed      Past Medical History  Diagnosis Date  . Polycystic ovaries   . Personal history of other diseases of digestive system   . Esophageal reflux   . Internal hemorrhoids without mention of complication   . Family history of malignant neoplasm of gastrointestinal tract   . Vestibular dysfunction     Mal de Debarquement syndrome     Past Surgical History  Procedure Laterality Date  . None reported      There were no vitals filed for this visit.  Visit Diagnosis:  Abnormality of gait  BPPV (benign paroxysmal positional vertigo), right      Subjective Assessment - 07/27/15 1148    Subjective The patient reports that spinning has improved, however she still notes bouncing and rocking.  She felt that right after eval, it was improved, but then symptoms worsened again.   Pertinent History h/o migraines in the past (no current issues per report), denies hearing changes, tinnitus, or visual changes.    Currently in Pain? Yes   Pain Score --  new onset of stomach soreness-not related to referral      NEUROMUSCULAR RE-EDUCATION: Gaze x 1 viewing standing in horizontal and vertical planes with target and cues to slow pace when visual blurring occurs x 2 reps each Seated on physioball  with bouncing, turning head horiz/vertical Standing corner exercise of 1/2 tandem with head turns and on foam with eyes closed without balance difficulties  SELF CARE/HOME MANAGEMENT: Educated patient on trying to use physioball for mgmt of rocking sensation at her desk and on days where symptoms are more severe Discussed progression of HEP and general wellness program           Vestibular Assessment - 07/27/15 1150    Positional Testing   Sidelying Test Sidelying Right;Sidelying Left   Horizontal Canal Testing Horizontal Canal Right;Horizontal Canal Left   Sidelying Right   Sidelying Right Symptoms No nystagmus   Sidelying Left   Sidelying Left Symptoms No nystagmus           PT Education - 07/27/15 1206    Education provided Yes   Education Details HEP: gaze x 1 viewing horizontal and vertical   Person(s) Educated Patient   Methods Explanation;Demonstration;Handout   Comprehension Returned demonstration;Verbalized understanding           PT Long Term Goals - 07/27/15 1228    PT LONG TERM GOAL #1   Title The patient will be indep with HEP for self mgmt of BPPV.   Baseline Target date 08/17/2015   Time 4   Period Weeks   Status On-going   PT LONG TERM GOAL #2   Title The patient will have negative L sidelying test indicating resolution of BPPV.  Baseline Target date 08/17/2015   Time 4   Period Weeks   Status Achieved   PT LONG TERM GOAL #3   Title The patient will reduce dizziness handicap index by 15% (provided via e-mail at end of last session).   Baseline Target date 08/17/2015   Time 4   Period Weeks   Status On-going   PT LONG TERM GOAL #4   Title The patient will verbalize understanding of general wellness program.   Baseline Target date 08/17/2015   Time 4   Period Weeks   Status On-going               Plan - 07/27/15 1223    Clinical Impression Statement The patient is negative for positional testing for BPPV today, however the  rocking still continues more notably when sitting still or when lying in bed.    PT Next Visit Plan check progress with gaze x 1 viewing (for visual blurring at end of 30 sec), progress HEP; check status of using physioball for sitting at desk or using when rocking sensation more significant.   Consulted and Agree with Plan of Care Patient        Problem List Patient Active Problem List   Diagnosis Date Noted  . Vestibular dysfunction 05/06/2015  . Anxiety state 12/16/2013  . ALLERGIC RHINITIS 06/13/2009  . VERTIGO 12/13/2008  . POLYCYSTIC OVARIAN DISEASE 04/14/2008  . GERD 04/14/2008  . IRRITABLE BOWEL SYNDROME, HX OF 04/14/2008  . VIRAL URI 12/09/2007  . OTHER ACUTE SINUSITIS 10/08/2007  . HEMORRHOIDS, INTERNAL 05/15/2007  . ESOPHAGITIS 01/20/2004    WEAVER,CHRISTINA, PT 07/27/2015, 12:31 PM  [facility] [address] [place], [place], [address] Phone: [phone]   Fax:  [phone]

## 2015-07-27 NOTE — Patient Instructions (Addendum)
    Gaze Stabilization: Tip Card 1.Target must remain in focus, not blurry, and appear stationary while head is in motion. 2.Perform exercises with small head movements (45 to either side of midline). 3.Increase speed of head motion so long as target is in focus. 4.If you wear eyeglasses, be sure you can see target through lens (therapist will give specific instructions for bifocal / progressive lenses). 5.These exercises may provoke dizziness or nausea. Work through these symptoms. If too dizzy, slow head movement slightly. Rest between each exercise. 6.Exercises demand concentration; avoid distractions. 7.For safety, perform standing exercises close to a counter, wall, corner, or next to someone.  Copyright  VHI. All rights reserved.  Gaze Stabilization: Standing Feet Apart   Feet shoulder width apart, keeping eyes on target on wall 3 feet away, tilt head down slightly and move head side to side for 30 seconds. Repeat while moving head up and down for 30 seconds. Do 3 sessions per day.   Copyright  VHI. All rights reserved.   Theraband Physioball size 75 cm *under inflated

## 2015-08-17 ENCOUNTER — Ambulatory Visit: Payer: 59 | Admitting: Rehabilitative and Restorative Service Providers"

## 2015-09-05 ENCOUNTER — Telehealth: Payer: Self-pay | Admitting: Internal Medicine

## 2015-09-05 NOTE — Telephone Encounter (Signed)
Is she ok for direct EGD/Colon?  Don't have an opening for a double for some time.  Please advise

## 2015-09-06 NOTE — Telephone Encounter (Signed)
Left message for patient to call back  

## 2015-09-06 NOTE — Telephone Encounter (Signed)
I think she needs to be called about sxs and prob needs PCP first

## 2015-09-06 NOTE — Telephone Encounter (Signed)
Patient is scheduled to see Dr. Leone PayorGessner for 11/10/15.  I did recommend that she see her PCP as recommended in the interim.

## 2015-09-07 ENCOUNTER — Encounter: Payer: Self-pay | Admitting: Rehabilitative and Restorative Service Providers"

## 2015-09-07 NOTE — Therapy (Signed)
[facility] [address] [place], [place], [address] Phone: [phone]   Fax:  [phone]  Patient Details  Name: Maria Duran MRN: [id number] Date of Birth: 1976-10-29 Referring Provider:  No ref. provider found  Encounter Date: last encounter on 07/27/2015  PHYSICAL THERAPY DISCHARGE SUMMARY  Visits from Start of Care: 2  Current functional level related to goals / functional outcomes: The patient did not return to PT after 2nd visit.     Remaining deficits: See initial eval for deficits   Education / Equipment: Home program  Plan: Patient agrees to discharge.  Patient goals were not met. Patient is being discharged due to not returning since the last visit.  ?????       Thank you for the referral of this patient. Rudell Cobb, MPT    Maria Duran 09/07/2015, 1:48 PM  [facility] [address] [place], [place], [address] Phone: [phone]   Fax:  [phone]

## 2015-11-10 ENCOUNTER — Encounter: Payer: Self-pay | Admitting: Internal Medicine

## 2015-11-10 ENCOUNTER — Ambulatory Visit (INDEPENDENT_AMBULATORY_CARE_PROVIDER_SITE_OTHER): Payer: BLUE CROSS/BLUE SHIELD | Admitting: Internal Medicine

## 2015-11-10 VITALS — BP 100/70 | HR 64 | Ht 66.25 in | Wt 189.6 lb

## 2015-11-10 DIAGNOSIS — K589 Irritable bowel syndrome without diarrhea: Secondary | ICD-10-CM | POA: Diagnosis not present

## 2015-11-10 DIAGNOSIS — R140 Abdominal distension (gaseous): Secondary | ICD-10-CM | POA: Diagnosis not present

## 2015-11-10 DIAGNOSIS — K219 Gastro-esophageal reflux disease without esophagitis: Secondary | ICD-10-CM | POA: Diagnosis not present

## 2015-11-10 MED ORDER — SACCHAROMYCES BOULARDII 250 MG PO CAPS: 250.0000 mg | ORAL_CAPSULE | Freq: Two times a day (BID) | ORAL | Status: DC

## 2015-11-10 MED ORDER — RANITIDINE HCL 150 MG PO CAPS: 150.0000 mg | ORAL_CAPSULE | Freq: Two times a day (BID) | ORAL | Status: DC

## 2015-11-10 NOTE — Patient Instructions (Signed)
  We have sent the following medications to your pharmacy for you to pick up at your convenience: Generic zantac,  Take for 2 months and then try and come off.  If unable to stop it then call us back as we may need to give you a different medicine.   Today we are giving you a handout to read and follow on GERD.   Please take over the counter Florastor for a month.    I appreciate the opportunity to care for you. Stan Headarl Gessner, MD, [facility]

## 2015-11-10 NOTE — Progress Notes (Signed)
Subjective:    Patient ID: Maria Duran, female    DOB: [birth year], 40 y.o.   MRN: [id number] chief complaint: Heartburn and bloating HPI The patient is a very nice middle-aged white woman I know from previous evaluations, it colonoscopy by me she had an EGD by Dr. Corinda Gubler. She has irritable bowel syndrome and also a history of acid reflux and heartburn, she had "mild esophagitis" and a small hiatal hernia and endoscopy in 2005. She felt like Nexium which was controlling her heartburn made her tongue numb a number of years ago so she stopped taking it.  Over the past several months or perhaps a year she developed more intermittent heartburn denies at least once a week. Her job has been stressful. She has been decreasing red wine and alcohol, avoiding spicy foods does not use caffeine. Her weight has gone up as well as she understands that that could contribute. A tablespoon of applesauce or vinegar seems to help some. No dysphagia and no bleeding. No unintentional weight loss.  She is also bloated quite a bit. When she defecates this improves she is not constipated.  Wt Readings from Last 3 Encounters:  11/10/15 189 lb 9.6 oz (86.002 kg)  05/06/15 179 lb (81.194 kg)  12/16/13 171 lb (77.565 kg)  Medications, allergies, past medical history, past surgical history, family history and social history are reviewed and updated in the EMR.   Review of Systems As per history of present illness. She has some low back pain and pedal edema.all other review of systems negative    Objective:   Physical Exam @BP  100/70 mmHg  Pulse 64  Ht 5' 6.25" (1.683 m)  Wt 189 lb 9.6 oz (86.002 kg)  BMI 30.36 kg/m2  LMP 10/10/2015 (Approximate)@  General:  Well-developed, well-nourished and in no acute distress obese Eyes:  anicteric. ENT:   Mouth and posterior pharynx free of lesions.  Neck:   supple w/o thyromegaly or mass.  Lungs: Clear to auscultation bilaterally. Heart:  S1S2, no rubs, murmurs,  gallops. Abdomen:  soft, non-tender, no hepatosplenomegaly, hernia, or mass and BS+.  Lymph:  no cervical or supraclavicular adenopathy. Extremities:   no edema, cyanosis or clubbing Skin   no rash. Neuro:  A&O x 3.  Psych:  appropriate mood and  Affect.   Data Reviewed: As per history of present illness    Assessment & Plan:   1. Gastroesophageal reflux disease, esophagitis presence not specified   2. Bloating   3. IBS (irritable bowel syndrome)    GERD symptoms are not that frequent. Were going to try ranitidine 150 mg twice a day. She will continue her lifestyle changes. She should take that for about 2 months, and then see if she can come off it. If it does not work at about a month, would probably prescribe a PPI. Would use something different than Nexium as she thought it made her tongue number in the past. Pantoprazole  if covered would be a good choice I think. I educated her about the warning signs of dysphagia anemia, unintentional weight loss and how that requires endoscopy but I don't think her current situation does.  Her bloating and IBS, take florastor 250 mg twice a day for a month to see if that makes a difference. She is on chronic ampicillin which may be disrupting her go for a some.   She will follow-up with me as needed with those instructions as above.Note that in the past I had recommended a  colonoscopy 10 years after her last because of a family history of colon cancer but that was in a grandparent and present guidelines would place her at routine risks or I would not do a routine preventative colonoscopy or other colon cancer screening test until about the age of 89.

## 2015-11-15 ENCOUNTER — Other Ambulatory Visit: Payer: Self-pay | Admitting: Gastroenterology

## 2015-11-15 DIAGNOSIS — R110 Nausea: Secondary | ICD-10-CM

## 2015-11-15 DIAGNOSIS — R1013 Epigastric pain: Secondary | ICD-10-CM

## 2015-11-15 DIAGNOSIS — R1011 Right upper quadrant pain: Secondary | ICD-10-CM

## 2015-11-30 ENCOUNTER — Encounter (HOSPITAL_COMMUNITY): Admission: RE | Admit: 2015-11-30 | Payer: BLUE CROSS/BLUE SHIELD | Source: Ambulatory Visit

## 2015-11-30 ENCOUNTER — Ambulatory Visit (HOSPITAL_COMMUNITY): Payer: BLUE CROSS/BLUE SHIELD

## 2016-01-09 ENCOUNTER — Other Ambulatory Visit: Payer: Self-pay | Admitting: *Deleted

## 2016-01-09 NOTE — Telephone Encounter (Signed)
Call in #90 with 5 rf 

## 2016-01-09 NOTE — Telephone Encounter (Signed)
[facility] [phone]#[id number] [address] [phone] Refill request clonazePAM (KLONOPIN) 0.5 MG tablet

## 2016-01-10 MED ORDER — CLONAZEPAM 0.5 MG PO TABS: ORAL_TABLET | ORAL | Status: DC

## 2016-01-10 NOTE — Telephone Encounter (Signed)
Rx was call in  

## 2016-04-04 DIAGNOSIS — N920 Excessive and frequent menstruation with regular cycle: Secondary | ICD-10-CM | POA: Diagnosis not present

## 2016-04-04 DIAGNOSIS — E282 Polycystic ovarian syndrome: Secondary | ICD-10-CM | POA: Diagnosis not present

## 2016-04-11 DIAGNOSIS — N926 Irregular menstruation, unspecified: Secondary | ICD-10-CM | POA: Diagnosis not present

## 2016-04-11 DIAGNOSIS — N920 Excessive and frequent menstruation with regular cycle: Secondary | ICD-10-CM | POA: Diagnosis not present

## 2016-04-11 DIAGNOSIS — N924 Excessive bleeding in the premenopausal period: Secondary | ICD-10-CM | POA: Diagnosis not present

## 2016-05-10 ENCOUNTER — Other Ambulatory Visit: Payer: Self-pay | Admitting: *Deleted

## 2016-05-10 DIAGNOSIS — N920 Excessive and frequent menstruation with regular cycle: Secondary | ICD-10-CM | POA: Diagnosis not present

## 2016-05-10 MED ORDER — SERTRALINE HCL 50 MG PO TABS: 50.0000 mg | ORAL_TABLET | Freq: Every day | ORAL | Status: DC

## 2016-05-10 NOTE — Telephone Encounter (Signed)
Rx done. 

## 2016-05-25 ENCOUNTER — Telehealth: Payer: Self-pay | Admitting: Family Medicine

## 2016-05-25 NOTE — Telephone Encounter (Signed)
Refill request for Ampicillin 500 mg take 1 po qd and a [facility]

## 2016-05-25 NOTE — Telephone Encounter (Signed)
Call in #90 with 3 rf  

## 2016-05-28 MED ORDER — AMPICILLIN 500 MG PO CAPS: 500.0000 mg | ORAL_CAPSULE | Freq: Every day | ORAL | 3 refills | Status: DC

## 2016-05-28 NOTE — Telephone Encounter (Signed)
I sent script e-scribe. 

## 2016-07-04 DIAGNOSIS — Z1321 Encounter for screening for nutritional disorder: Secondary | ICD-10-CM | POA: Diagnosis not present

## 2016-07-04 DIAGNOSIS — Z13220 Encounter for screening for lipoid disorders: Secondary | ICD-10-CM | POA: Diagnosis not present

## 2016-07-04 DIAGNOSIS — Z01419 Encounter for gynecological examination (general) (routine) without abnormal findings: Secondary | ICD-10-CM | POA: Diagnosis not present

## 2016-07-04 DIAGNOSIS — Z1231 Encounter for screening mammogram for malignant neoplasm of breast: Secondary | ICD-10-CM | POA: Diagnosis not present

## 2016-07-04 DIAGNOSIS — Z131 Encounter for screening for diabetes mellitus: Secondary | ICD-10-CM | POA: Diagnosis not present

## 2016-07-04 DIAGNOSIS — Z1329 Encounter for screening for other suspected endocrine disorder: Secondary | ICD-10-CM | POA: Diagnosis not present

## 2016-07-04 DIAGNOSIS — Z6831 Body mass index (BMI) 31.0-31.9, adult: Secondary | ICD-10-CM | POA: Diagnosis not present

## 2016-07-10 ENCOUNTER — Other Ambulatory Visit: Payer: Self-pay | Admitting: Obstetrics and Gynecology

## 2016-07-10 DIAGNOSIS — R928 Other abnormal and inconclusive findings on diagnostic imaging of breast: Secondary | ICD-10-CM

## 2016-07-10 DIAGNOSIS — N63 Unspecified lump in breast: Secondary | ICD-10-CM | POA: Diagnosis not present

## 2016-07-17 ENCOUNTER — Ambulatory Visit
Admission: RE | Admit: 2016-07-17 | Discharge: 2016-07-17 | Disposition: A | Discharge status: Home / Self Care | Payer: BLUE CROSS/BLUE SHIELD | Source: Ambulatory Visit | Attending: Obstetrics and Gynecology | Admitting: Obstetrics and Gynecology

## 2016-07-17 DIAGNOSIS — R928 Other abnormal and inconclusive findings on diagnostic imaging of breast: Secondary | ICD-10-CM

## 2016-07-17 DIAGNOSIS — N6002 Solitary cyst of left breast: Secondary | ICD-10-CM | POA: Diagnosis not present

## 2016-08-09 ENCOUNTER — Telehealth: Payer: Self-pay | Admitting: Family Medicine

## 2016-08-09 MED ORDER — SERTRALINE HCL 50 MG PO TABS: 50.0000 mg | ORAL_TABLET | Freq: Every day | ORAL | 0 refills | Status: DC

## 2016-08-09 NOTE — Telephone Encounter (Signed)
Pt needs a refill on zoloft [facility] [address]

## 2016-08-09 NOTE — Telephone Encounter (Signed)
Rx sent in for 30 days. Patient needs an appointment.

## 2016-09-10 ENCOUNTER — Other Ambulatory Visit: Payer: Self-pay | Admitting: Family Medicine

## 2016-09-10 NOTE — Telephone Encounter (Signed)
Call in #30 with 2 rf 

## 2016-09-10 NOTE — Telephone Encounter (Signed)
Pt request refill  sertraline (ZOLOFT) 50 MG tablet 60 day supply  [facility] [address]  Pt has made cpe on 12/18 , but needs a refill of this sertraline (ZOLOFT) 50 MG tablet to get through.  That was first available am appt For a cpe in the am

## 2016-09-12 MED ORDER — SERTRALINE HCL 50 MG PO TABS: 50.0000 mg | ORAL_TABLET | Freq: Every day | ORAL | 2 refills | Status: DC

## 2016-09-12 NOTE — Telephone Encounter (Signed)
Rx sent 

## 2016-10-22 ENCOUNTER — Ambulatory Visit (INDEPENDENT_AMBULATORY_CARE_PROVIDER_SITE_OTHER): Payer: BLUE CROSS/BLUE SHIELD | Admitting: Family Medicine

## 2016-10-22 ENCOUNTER — Encounter: Payer: Self-pay | Admitting: Family Medicine

## 2016-10-22 VITALS — BP 116/79 | HR 73 | Temp 98.8°F | Ht 66.25 in | Wt 189.0 lb

## 2016-10-22 DIAGNOSIS — R202 Paresthesia of skin: Secondary | ICD-10-CM | POA: Diagnosis not present

## 2016-10-22 DIAGNOSIS — Z0000 Encounter for general adult medical examination without abnormal findings: Secondary | ICD-10-CM | POA: Diagnosis not present

## 2016-10-22 DIAGNOSIS — R200 Anesthesia of skin: Secondary | ICD-10-CM | POA: Diagnosis not present

## 2016-10-22 LAB — CBC WITH DIFFERENTIAL/PLATELET
BASOS ABS: 0.1 10*3/uL (ref 0.0–0.1)
BASOS PCT: 0.9 % (ref 0.0–3.0)
EOS ABS: 0.5 10*3/uL (ref 0.0–0.7)
Eosinophils Relative: 6.1 % — ABNORMAL HIGH (ref 0.0–5.0)
HEMATOCRIT: 36.7 % (ref 36.0–46.0)
HEMOGLOBIN: 12.2 g/dL (ref 12.0–15.0)
LYMPHS PCT: 29.1 % (ref 12.0–46.0)
Lymphs Abs: 2.2 10*3/uL (ref 0.7–4.0)
MCHC: 33.2 g/dL (ref 30.0–36.0)
MCV: 82.5 fl (ref 78.0–100.0)
MONO ABS: 0.6 10*3/uL (ref 0.1–1.0)
Monocytes Relative: 7.8 % (ref 3.0–12.0)
NEUTROS ABS: 4.3 10*3/uL (ref 1.4–7.7)
Neutrophils Relative %: 56.1 % (ref 43.0–77.0)
PLATELETS: 390 10*3/uL (ref 150.0–400.0)
RBC: 4.45 Mil/uL (ref 3.87–5.11)
RDW: 14.5 % (ref 11.5–15.5)
WBC: 7.6 10*3/uL (ref 4.0–10.5)

## 2016-10-22 LAB — LIPID PANEL
CHOLESTEROL: 229 mg/dL — AB (ref 0–200)
HDL: 54.8 mg/dL (ref >39.00)
LDL CALC: 148 mg/dL — AB (ref 0–99)
NonHDL: 173.86
TRIGLYCERIDES: 129 mg/dL (ref 0.0–149.0)
Total CHOL/HDL Ratio: 4
VLDL: 25.8 mg/dL (ref 0.0–40.0)

## 2016-10-22 LAB — HEPATIC FUNCTION PANEL
ALBUMIN: 4.3 g/dL (ref 3.5–5.2)
ALT: 7 U/L (ref 0–35)
AST: 13 U/L (ref 0–37)
Alkaline Phosphatase: 64 U/L (ref 39–117)
Bilirubin, Direct: 0.1 mg/dL (ref 0.0–0.3)
TOTAL PROTEIN: 6.8 g/dL (ref 6.0–8.3)
Total Bilirubin: 0.4 mg/dL (ref 0.2–1.2)

## 2016-10-22 LAB — BASIC METABOLIC PANEL
BUN: 13 mg/dL (ref 6–23)
CALCIUM: 9.4 mg/dL (ref 8.4–10.5)
CO2: 28 meq/L (ref 19–32)
CREATININE: 0.64 mg/dL (ref 0.40–1.20)
Chloride: 101 mEq/L (ref 96–112)
GFR: 108.76 mL/min (ref >60.00)
GLUCOSE: 92 mg/dL (ref 70–99)
Potassium: 3.9 mEq/L (ref 3.5–5.1)
SODIUM: 138 meq/L (ref 135–145)

## 2016-10-22 LAB — POC URINALSYSI DIPSTICK (AUTOMATED)
Bilirubin, UA: NEGATIVE
GLUCOSE UA: NEGATIVE
Ketones, UA: NEGATIVE
Leukocytes, UA: NEGATIVE
NITRITE UA: NEGATIVE
PH UA: 6
Protein, UA: NEGATIVE
RBC UA: NEGATIVE
SPEC GRAV UA: 1.015
UROBILINOGEN UA: 0.2

## 2016-10-22 LAB — TSH: TSH: 1.34 u[IU]/mL (ref 0.35–4.50)

## 2016-10-22 LAB — VITAMIN B12: Vitamin B-12: 448 pg/mL (ref 211–911)

## 2016-10-22 MED ORDER — OMEPRAZOLE 40 MG PO CPDR: 40.0000 mg | DELAYED_RELEASE_CAPSULE | Freq: Every day | ORAL | 3 refills | Status: DC

## 2016-10-22 MED ORDER — CLONAZEPAM 0.5 MG PO TABS: ORAL_TABLET | ORAL | 1 refills | Status: DC

## 2016-10-22 MED ORDER — SERTRALINE HCL 50 MG PO TABS: 50.0000 mg | ORAL_TABLET | Freq: Every day | ORAL | 3 refills | Status: DC

## 2016-10-22 NOTE — Progress Notes (Signed)
Pre visit review using our clinic review tool, if applicable. No additional management support is needed unless otherwise documented below in the visit note. 

## 2016-10-22 NOTE — Progress Notes (Signed)
   Subjective:    Patient ID: Maria Duran, female    DOB: 07/28/1976, 40 y.o.   MRN: [id number]  HPI 40 yr old female for a well exam. She feels fine except for daily numbness and tingling in both hands. This started about a year ago. She occasionally has weakness in the hands but has never dropped things. No pain or swelling. No symptoms in the feet at all. 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    Review of Systems  HENT: Negative.   Eyes: Negative.   Respiratory: Negative.   Cardiovascular: Negative.   Gastrointestinal: Negative.   Genitourinary: Negative for decreased urine volume, difficulty urinating, dyspareunia, dysuria, enuresis, flank pain, frequency, hematuria, pelvic pain and urgency.  Musculoskeletal: Negative.   Skin: Negative.   Neurological: Positive for weakness and numbness. Negative for dizziness, tremors, seizures, syncope, facial asymmetry, speech difficulty, light-headedness and headaches.  Psychiatric/Behavioral: Negative.        Objective:   Physical Exam  Constitutional: She is oriented to person, place, and time. She appears well-developed and well-nourished. No distress.  HENT:  Head: Normocephalic and atraumatic.  Right Ear: External ear normal.  Left Ear: External ear normal.  Nose: Nose normal.  Mouth/Throat: Oropharynx is clear and moist. No oropharyngeal exudate.  Eyes: Conjunctivae and EOM are normal. Pupils are equal, round, and reactive to light. No scleral icterus.  Neck: Normal range of motion. Neck supple. No JVD present. No thyromegaly present.  Cardiovascular: Normal rate, regular rhythm, normal heart sounds and intact distal pulses.  Exam reveals no gallop and no friction rub.   No murmur heard. Pulmonary/Chest: Effort normal and breath sounds normal. No respiratory distress. She has no wheezes. She has no rales. She exhibits no tenderness.  Abdominal: Soft. Bowel sounds are normal. She exhibits no distension and no mass. There is  no tenderness. There is no rebound and no guarding.  Musculoskeletal: Normal range of motion. She exhibits no edema or tenderness.  The hands appear normal   Lymphadenopathy:    She has no cervical adenopathy.  Neurological: She is alert and oriented to person, place, and time. She has normal reflexes. No cranial nerve deficit. She exhibits normal muscle tone. Coordination normal.  Skin: Skin is warm and dry. No rash noted. No erythema.  Psychiatric: She has a normal mood and affect. Her behavior is normal. Judgment and thought content normal.          Assessment & Plan:  Well exam. We discussed diet and exercise. 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 Get labs today.  Nelwyn SalisburyFRY,Dacian Orrico A, MD

## 2017-01-03 IMAGING — MG 2D DIGITAL DIAGNOSTIC UNILATERAL LEFT MAMMOGRAM WITH CAD AND ADJ
1 series · 1 of 5 positions shown · non-contrast
Comparison: 07/17/2016 and earlier priors

CLINICAL DATA: Possible mass left breast identified on recent
screening mammogram.

EXAM:
2D DIGITAL DIAGNOSTIC LEFT MAMMOGRAM WITH CAD AND ADJUNCT TOMO
ULTRASOUND LEFT BREAST

[L MLO tomo · tomo slice 41/82.0]
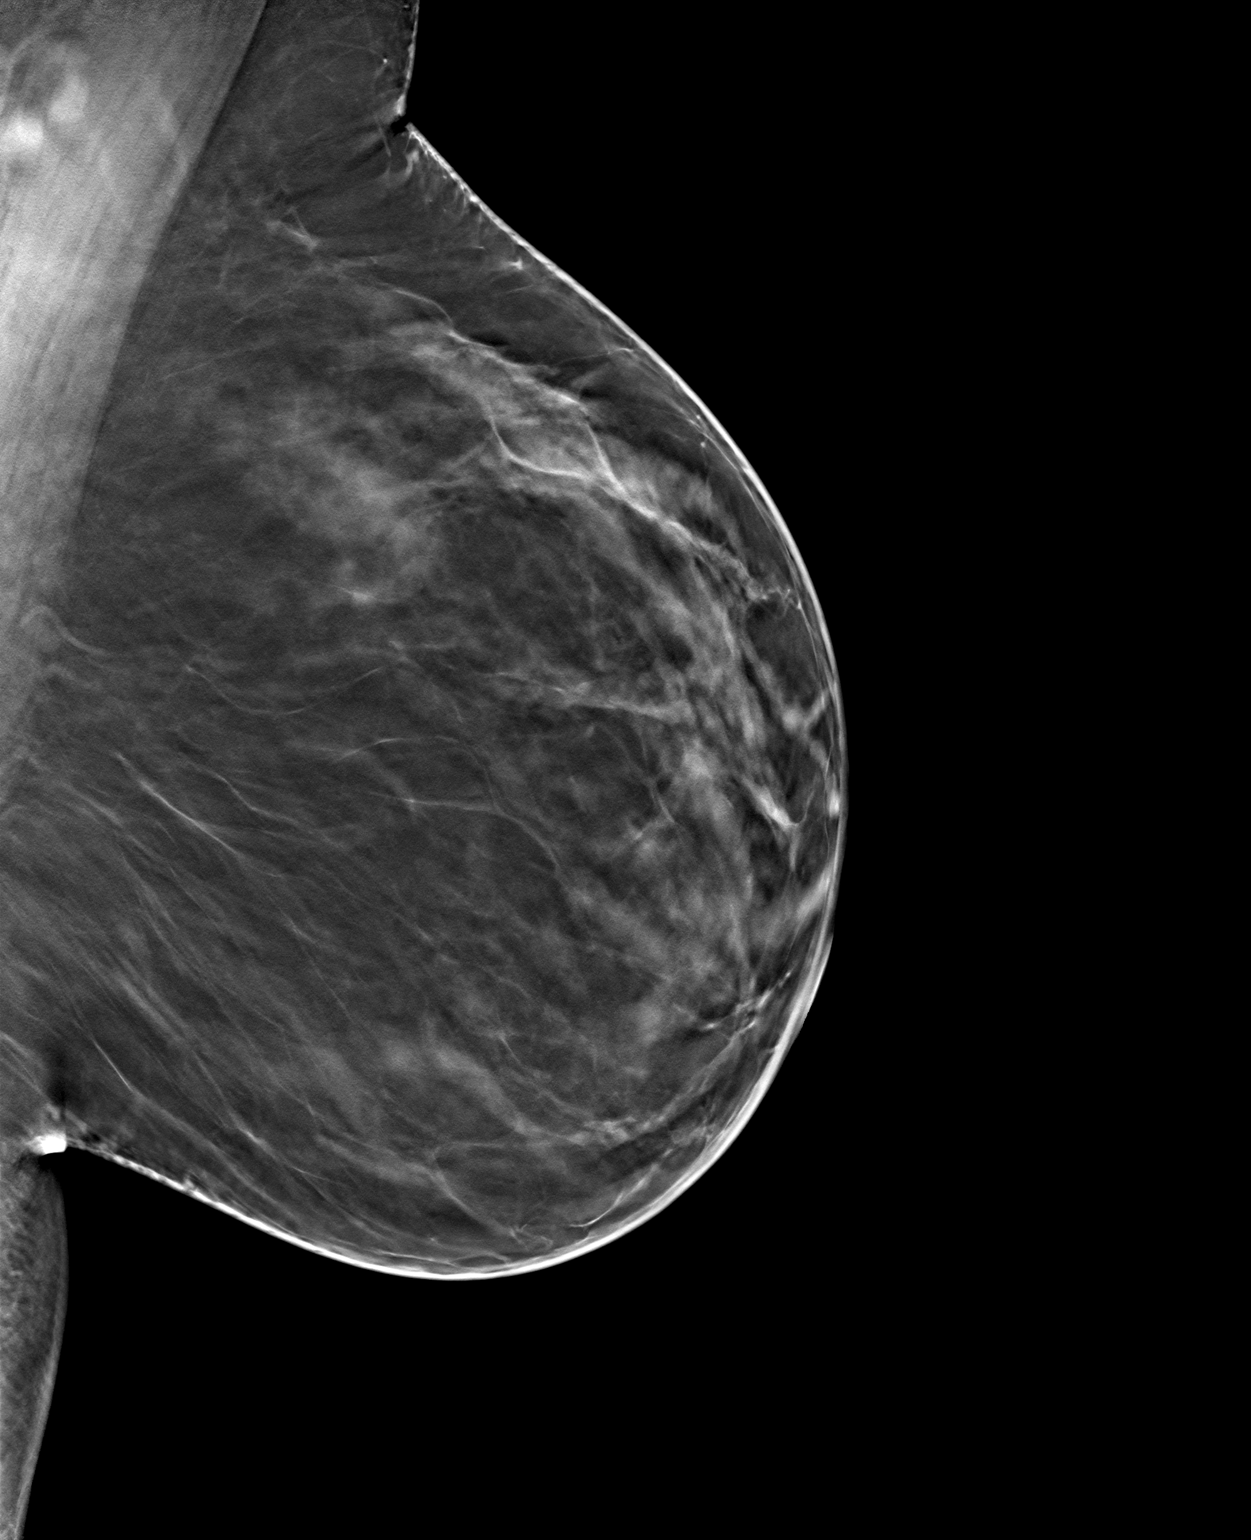

[1 of 5 positions shown; findings below may reference images not displayed]

ACR Breast Density Category b: There are scattered areas of
fibroglandular density.
FINDINGS: There is an approximately 4 mm circumscribed nodule in the lower
outer left breast that appears to be associated with or very near
the skin on the tomographic images performed today. The patient has
a visible mole in the medial left breast that corresponds to a
stable circumscribed nodule posteriorly, medial to the nipple. There
is no distortion in the left breast.

Mammographic images were processed with CAD.

On physical exam, there is a focal superficial 5 mm smooth palpable
lump at [DATE] position left breast 10 cm from nipple. The skin is
normal in color.

Targeted ultrasound is performed, showing a complex cyst associated
with the dermis and subcutaneous tissue at [DATE] position 10 cm from
the nipple measuring 5 x 4 x 4 mm. No internal vascular flow.
Probable skin tract imaged.
IMPRESSION: Benign sebaceous cyst for 30 position left breast accounts for the
nodule seen on the mammogram. No evidence of malignancy.

RECOMMENDATION:
Screening mammogram in one year.(Code:1U-8-GR4)

I have discussed the findings and recommendations with the patient.
Results were also provided in writing at the conclusion of the
visit. If applicable, a reminder letter will be sent to the patient
regarding the next appointment.

BI-RADS CATEGORY  2: Benign.

## 2017-01-03 IMAGING — US ULTRASOUND LEFT BREAST LIMITED
1 series · 7 of 7 positions shown · non-contrast
Comparison: 07/17/2016 and earlier priors

CLINICAL DATA: Possible mass left breast identified on recent
screening mammogram.

EXAM:
2D DIGITAL DIAGNOSTIC LEFT MAMMOGRAM WITH CAD AND ADJUNCT TOMO
ULTRASOUND LEFT BREAST

[Series 1: ultrasound left breast limited · 0.06mm/px · 7 of 7 slices shown]
[im 1/7]
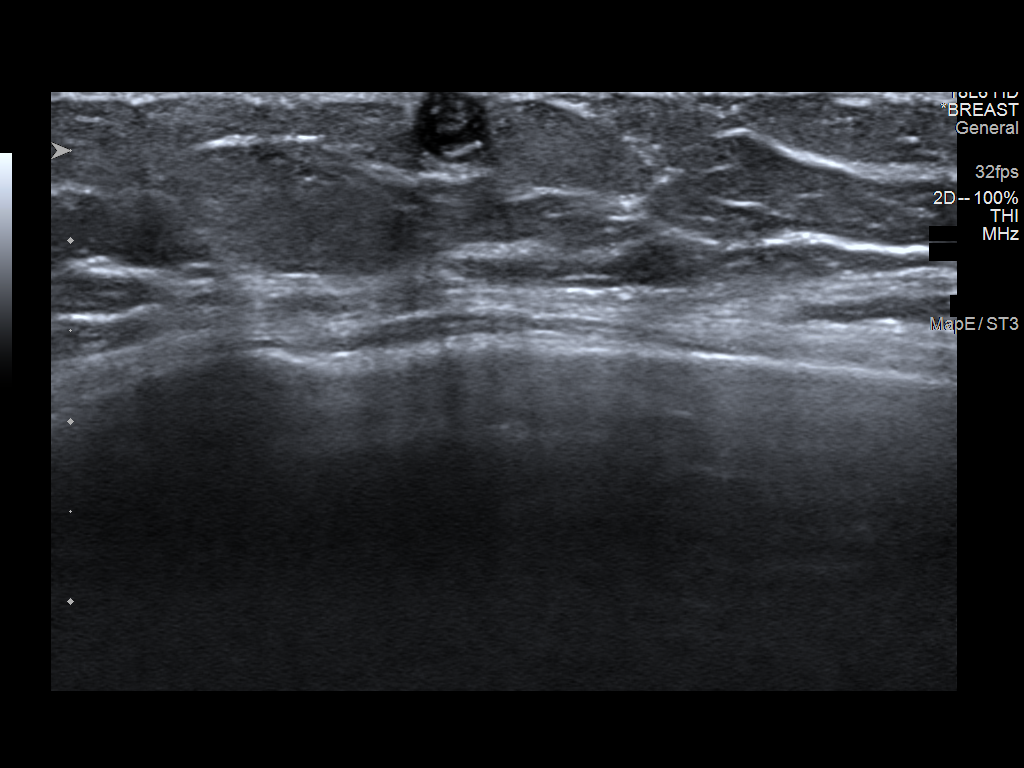
[im 2/7]
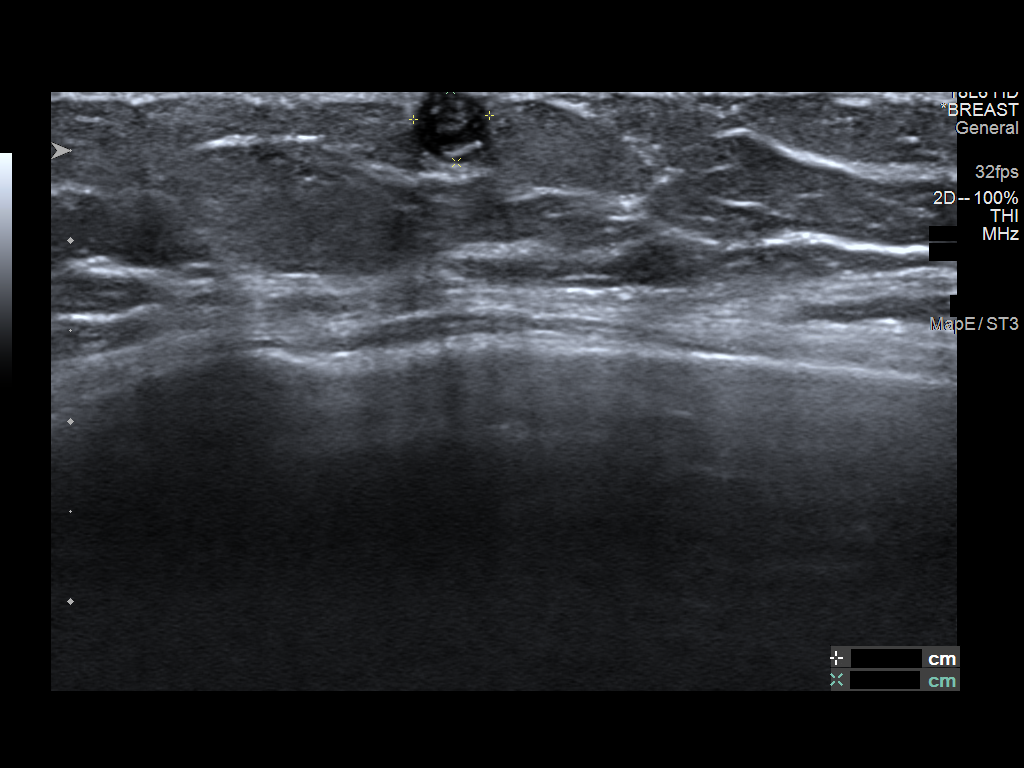
[im 3/7]
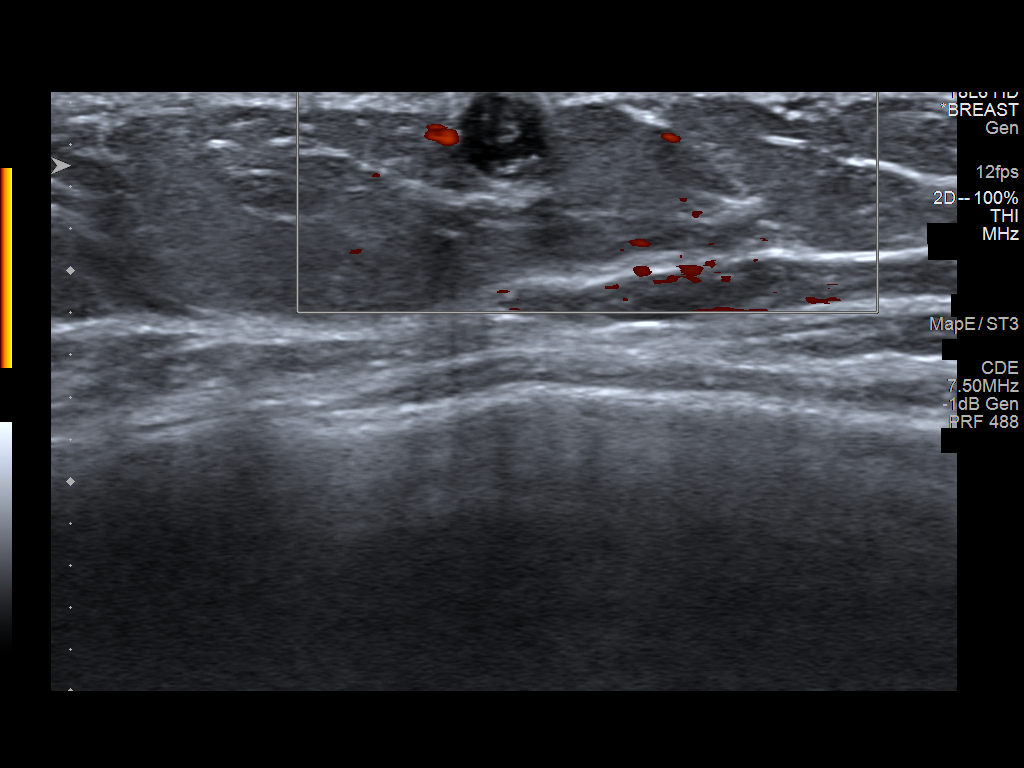
[im 4/7]
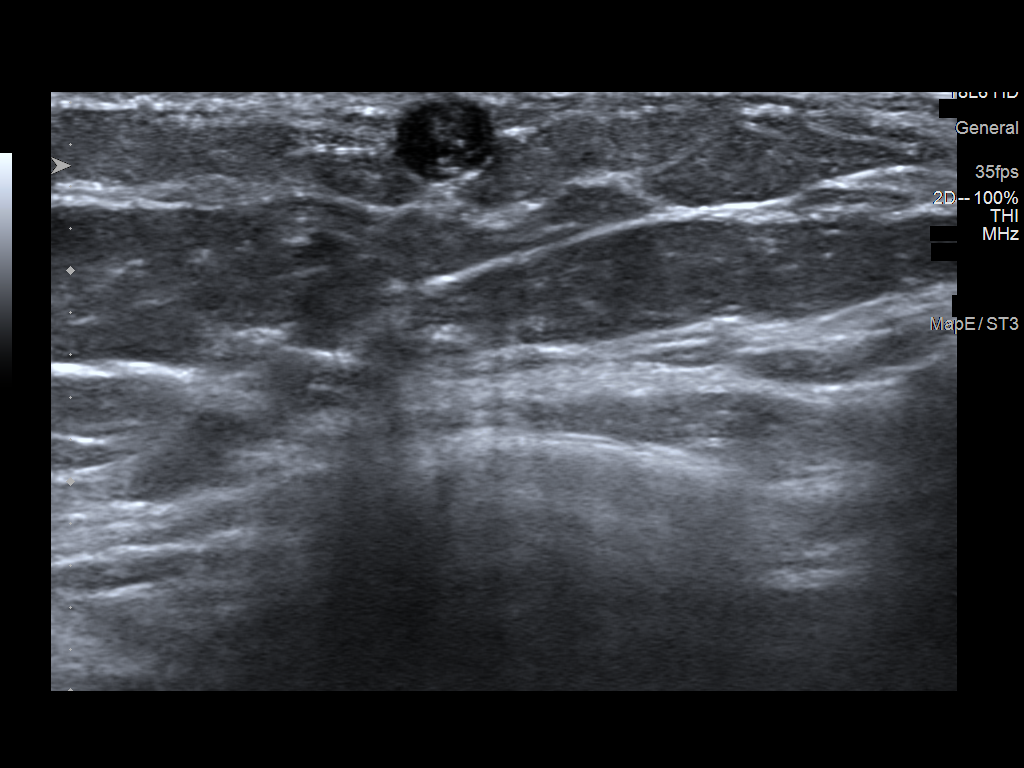
[im 5/7]
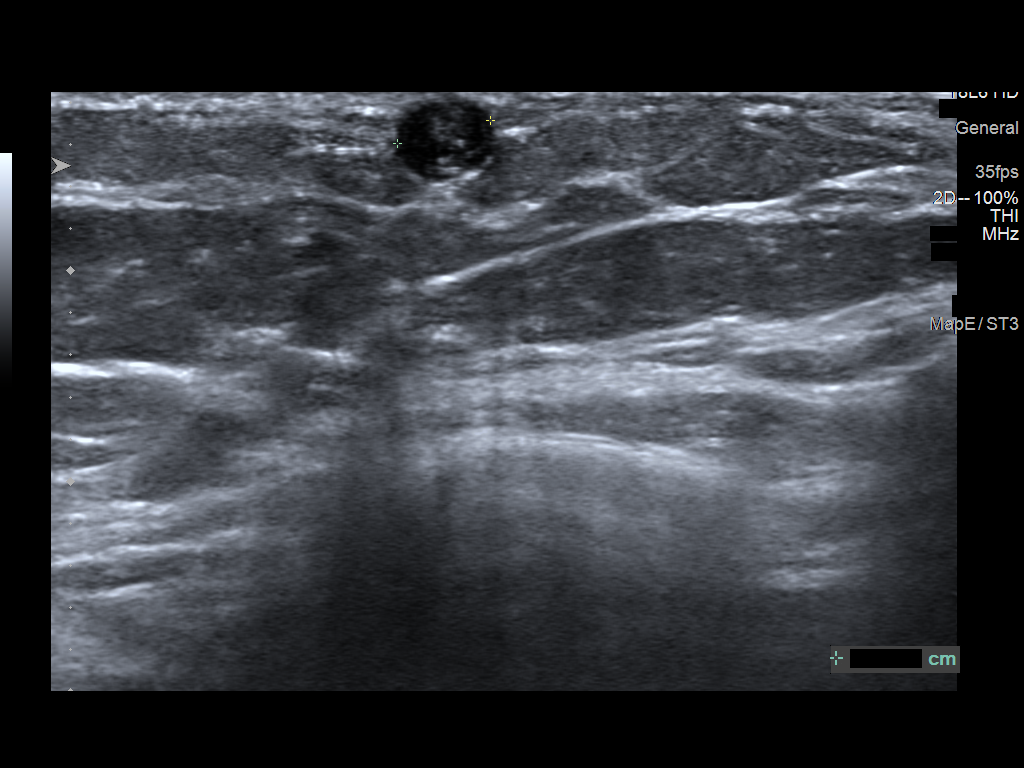
[im 6/7]
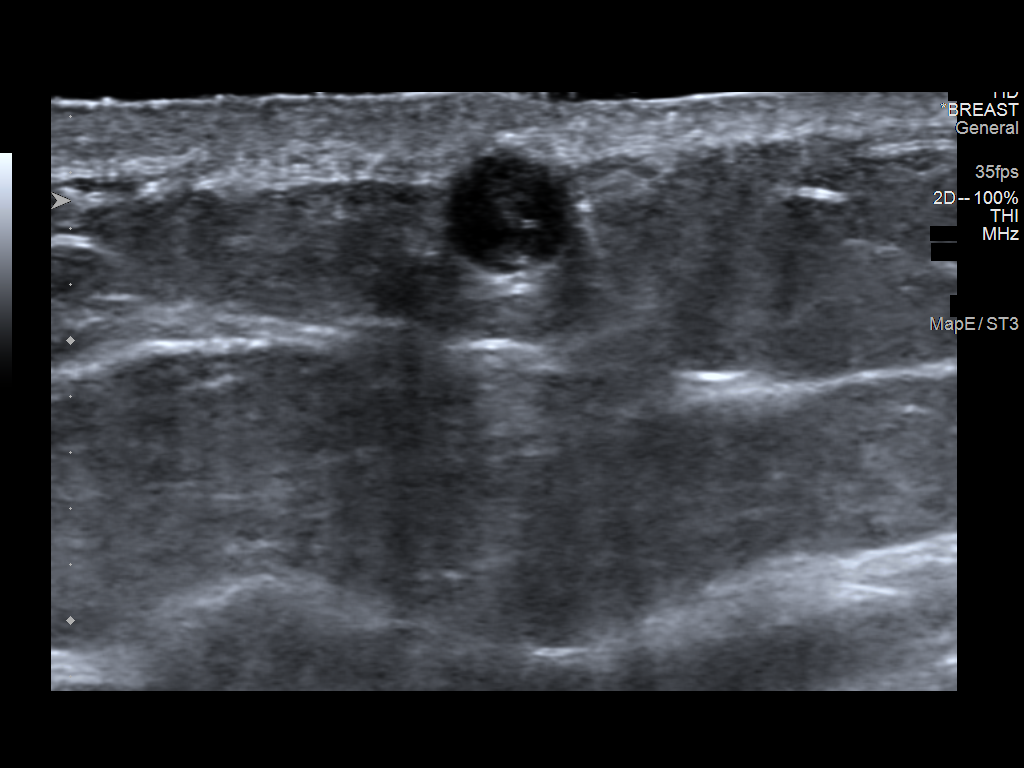
[im 7/7]
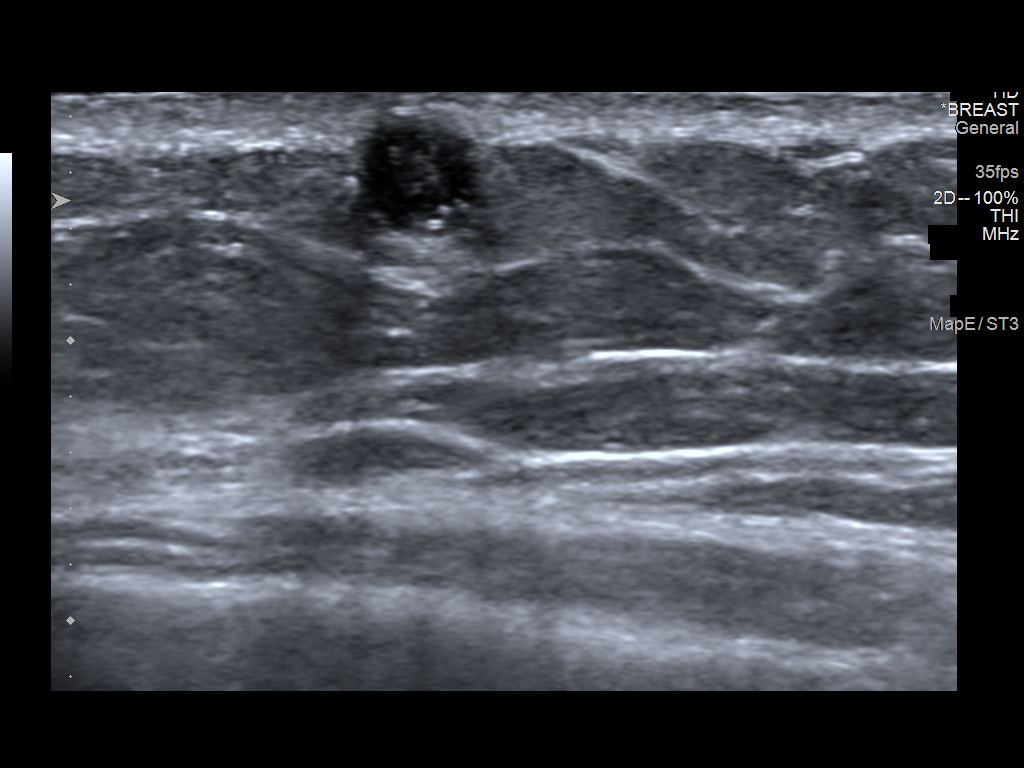

[7 of 7 positions shown; findings below may reference images not displayed]

ACR Breast Density Category b: There are scattered areas of
fibroglandular density.
FINDINGS: There is an approximately 4 mm circumscribed nodule in the lower
outer left breast that appears to be associated with or very near
the skin on the tomographic images performed today. The patient has
a visible mole in the medial left breast that corresponds to a
stable circumscribed nodule posteriorly, medial to the nipple. There
is no distortion in the left breast.

Mammographic images were processed with CAD.

On physical exam, there is a focal superficial 5 mm smooth palpable
lump at [DATE] position left breast 10 cm from nipple. The skin is
normal in color.

Targeted ultrasound is performed, showing a complex cyst associated
with the dermis and subcutaneous tissue at [DATE] position 10 cm from
the nipple measuring 5 x 4 x 4 mm. No internal vascular flow.
Probable skin tract imaged.
IMPRESSION: Benign sebaceous cyst for 30 position left breast accounts for the
nodule seen on the mammogram. No evidence of malignancy.

RECOMMENDATION:
Screening mammogram in one year.(Code:1U-8-GR4)

I have discussed the findings and recommendations with the patient.
Results were also provided in writing at the conclusion of the
visit. If applicable, a reminder letter will be sent to the patient
regarding the next appointment.

BI-RADS CATEGORY  2: Benign.

## 2017-05-29 DIAGNOSIS — R102 Pelvic and perineal pain: Secondary | ICD-10-CM | POA: Diagnosis not present

## 2017-05-29 DIAGNOSIS — N760 Acute vaginitis: Secondary | ICD-10-CM | POA: Diagnosis not present

## 2017-06-09 ENCOUNTER — Encounter: Payer: Self-pay | Admitting: Internal Medicine

## 2017-07-02 ENCOUNTER — Encounter: Payer: Self-pay | Admitting: Internal Medicine

## 2017-07-25 ENCOUNTER — Encounter: Payer: Self-pay | Admitting: Family Medicine

## 2017-08-27 DIAGNOSIS — Z131 Encounter for screening for diabetes mellitus: Secondary | ICD-10-CM | POA: Diagnosis not present

## 2017-08-27 DIAGNOSIS — Z1329 Encounter for screening for other suspected endocrine disorder: Secondary | ICD-10-CM | POA: Diagnosis not present

## 2017-08-27 DIAGNOSIS — Z01419 Encounter for gynecological examination (general) (routine) without abnormal findings: Secondary | ICD-10-CM | POA: Diagnosis not present

## 2017-08-27 DIAGNOSIS — N760 Acute vaginitis: Secondary | ICD-10-CM | POA: Diagnosis not present

## 2017-08-27 DIAGNOSIS — Z13220 Encounter for screening for lipoid disorders: Secondary | ICD-10-CM | POA: Diagnosis not present

## 2017-08-27 DIAGNOSIS — Z6831 Body mass index (BMI) 31.0-31.9, adult: Secondary | ICD-10-CM | POA: Diagnosis not present

## 2017-08-27 DIAGNOSIS — Z1231 Encounter for screening mammogram for malignant neoplasm of breast: Secondary | ICD-10-CM | POA: Diagnosis not present

## 2017-08-27 DIAGNOSIS — Z1321 Encounter for screening for nutritional disorder: Secondary | ICD-10-CM | POA: Diagnosis not present

## 2017-10-20 ENCOUNTER — Other Ambulatory Visit: Payer: Self-pay | Admitting: Family Medicine

## 2017-10-21 NOTE — Telephone Encounter (Signed)
Call pt what do they need this refilled for?

## 2017-10-21 NOTE — Telephone Encounter (Signed)
Called pt and left a VM asking what they needed the antibiotic refilled for? Last refilled 05/28/2016 for 90 days with 3 refills.

## 2017-10-22 NOTE — Telephone Encounter (Signed)
Pt takes this medication for rosacea on her face. [facility] [address] Sent to PCP

## 2017-12-18 ENCOUNTER — Other Ambulatory Visit: Payer: Self-pay | Admitting: Family Medicine

## 2017-12-25 ENCOUNTER — Telehealth: Payer: Self-pay | Admitting: Family Medicine

## 2017-12-25 NOTE — Telephone Encounter (Signed)
Copied from CRM [phone]#[id number]. Topic: Quick Communication - Rx Refill/Question >> Dec 25, 2017  6:47 PM Alexander BergeronBarksdale, Harvey B wrote: Medication: sertraline (ZOLOFT) 50 MG tablet [[id number]][[id number]] ,  omeprazole (PRILOSEC) 40 MG capsule [[id number]][[id number]]    Has the patient contacted their pharmacy? Yes.     (Agent: If no, request that the patient contact the pharmacy for the refill.)   Preferred Pharmacy (with phone number [facility] street [facility]): [facility]   Agent: Please be advised that RX refills may take up to 3 business days. We ask that you follow-up with your pharmacy.

## 2017-12-26 NOTE — Telephone Encounter (Signed)
Last OV 10/22/2016   Last refilled last refilled 10/22/2016 disp 90 with 3 refills   Pt is scheduled to bee seen on the 2/25   Sent to PCP for approval

## 2017-12-26 NOTE — Telephone Encounter (Signed)
Pt would like this medication refilled before her appt so that she doesn't run out [facility] to [facility] [id number] [facility]

## 2017-12-27 MED ORDER — SERTRALINE HCL 50 MG PO TABS: 50.0000 mg | ORAL_TABLET | Freq: Every day | ORAL | 3 refills | Status: AC

## 2017-12-27 NOTE — Telephone Encounter (Signed)
Rx has been sent  

## 2017-12-27 NOTE — Telephone Encounter (Signed)
Call in #90 of each with 3 rf

## 2017-12-30 ENCOUNTER — Ambulatory Visit: Payer: BLUE CROSS/BLUE SHIELD | Admitting: Family Medicine

## 2017-12-30 ENCOUNTER — Encounter: Payer: Self-pay | Admitting: Family Medicine

## 2017-12-30 VITALS — BP 98/62 | HR 72 | Temp 98.3°F | Wt 187.8 lb

## 2017-12-30 DIAGNOSIS — B373 Candidiasis of vulva and vagina: Secondary | ICD-10-CM

## 2017-12-30 DIAGNOSIS — B3731 Acute candidiasis of vulva and vagina: Secondary | ICD-10-CM

## 2017-12-30 DIAGNOSIS — F411 Generalized anxiety disorder: Secondary | ICD-10-CM | POA: Diagnosis not present

## 2017-12-30 DIAGNOSIS — K219 Gastro-esophageal reflux disease without esophagitis: Secondary | ICD-10-CM

## 2017-12-30 MED ORDER — FLUCONAZOLE 150 MG PO TABS: 150.0000 mg | ORAL_TABLET | Freq: Once | ORAL | 2 refills | Status: AC

## 2017-12-30 MED ORDER — CLONAZEPAM 0.5 MG PO TABS: ORAL_TABLET | ORAL | 1 refills | Status: AC

## 2017-12-30 MED ORDER — OMEPRAZOLE 40 MG PO CPDR: 40.0000 mg | DELAYED_RELEASE_CAPSULE | Freq: Every day | ORAL | 3 refills | Status: DC

## 2017-12-30 NOTE — Progress Notes (Signed)
   Subjective:    Patient ID: Maria Duran, female    DOB: 08/17/1976, 42 y.o.   MRN: [id number]  HPI Here to follow up. Her GERD and anxiety are stable. She also has had one week of a white odorless vaginal DC with burning and itching. She tried Monistat with no relief.    Review of Systems  Constitutional: Negative.   Respiratory: Negative.   Cardiovascular: Negative.   Gastrointestinal: Negative.   Genitourinary: Positive for vaginal discharge.  Psychiatric/Behavioral: Negative.        Objective:   Physical Exam  Constitutional: She is oriented to person, place, and time. She appears well-developed and well-nourished.  Cardiovascular: Normal rate, regular rhythm, normal heart sounds and intact distal pulses.  Pulmonary/Chest: Effort normal and breath sounds normal. No respiratory distress. She has no wheezes. She has no rales.  Neurological: She is alert and oriented to person, place, and time.  Psychiatric: She has a normal mood and affect. Her behavior is normal. Thought content normal.          Assessment & Plan:  Her GERD and anxiety are stable, meds were refilled. Treat the vaginitis with Diflucan. Gershon CraneStephen Fry, MD

## 2018-01-28 DIAGNOSIS — N760 Acute vaginitis: Secondary | ICD-10-CM | POA: Diagnosis not present

## 2018-01-28 DIAGNOSIS — Z113 Encounter for screening for infections with a predominantly sexual mode of transmission: Secondary | ICD-10-CM | POA: Diagnosis not present

## 2018-02-03 DIAGNOSIS — M5412 Radiculopathy, cervical region: Secondary | ICD-10-CM | POA: Diagnosis not present

## 2018-02-03 DIAGNOSIS — M9901 Segmental and somatic dysfunction of cervical region: Secondary | ICD-10-CM | POA: Diagnosis not present

## 2018-02-03 DIAGNOSIS — M5020 Other cervical disc displacement, unspecified cervical region: Secondary | ICD-10-CM | POA: Diagnosis not present

## 2018-02-03 DIAGNOSIS — M9902 Segmental and somatic dysfunction of thoracic region: Secondary | ICD-10-CM | POA: Diagnosis not present

## 2018-02-05 DIAGNOSIS — M5020 Other cervical disc displacement, unspecified cervical region: Secondary | ICD-10-CM | POA: Diagnosis not present

## 2018-02-05 DIAGNOSIS — M5412 Radiculopathy, cervical region: Secondary | ICD-10-CM | POA: Diagnosis not present

## 2018-02-05 DIAGNOSIS — M9901 Segmental and somatic dysfunction of cervical region: Secondary | ICD-10-CM | POA: Diagnosis not present

## 2018-02-05 DIAGNOSIS — M9902 Segmental and somatic dysfunction of thoracic region: Secondary | ICD-10-CM | POA: Diagnosis not present

## 2018-02-07 DIAGNOSIS — M9901 Segmental and somatic dysfunction of cervical region: Secondary | ICD-10-CM | POA: Diagnosis not present

## 2018-02-07 DIAGNOSIS — M5412 Radiculopathy, cervical region: Secondary | ICD-10-CM | POA: Diagnosis not present

## 2018-02-07 DIAGNOSIS — M9902 Segmental and somatic dysfunction of thoracic region: Secondary | ICD-10-CM | POA: Diagnosis not present

## 2018-02-07 DIAGNOSIS — M5020 Other cervical disc displacement, unspecified cervical region: Secondary | ICD-10-CM | POA: Diagnosis not present

## 2018-02-10 DIAGNOSIS — M5020 Other cervical disc displacement, unspecified cervical region: Secondary | ICD-10-CM | POA: Diagnosis not present

## 2018-02-10 DIAGNOSIS — M9902 Segmental and somatic dysfunction of thoracic region: Secondary | ICD-10-CM | POA: Diagnosis not present

## 2018-02-10 DIAGNOSIS — M9901 Segmental and somatic dysfunction of cervical region: Secondary | ICD-10-CM | POA: Diagnosis not present

## 2018-02-10 DIAGNOSIS — M5412 Radiculopathy, cervical region: Secondary | ICD-10-CM | POA: Diagnosis not present

## 2018-02-25 DIAGNOSIS — M5412 Radiculopathy, cervical region: Secondary | ICD-10-CM | POA: Diagnosis not present

## 2018-02-25 DIAGNOSIS — M542 Cervicalgia: Secondary | ICD-10-CM | POA: Diagnosis not present

## 2018-05-12 DIAGNOSIS — N760 Acute vaginitis: Secondary | ICD-10-CM | POA: Diagnosis not present

## 2018-05-12 DIAGNOSIS — N72 Inflammatory disease of cervix uteri: Secondary | ICD-10-CM | POA: Diagnosis not present

## 2018-05-12 DIAGNOSIS — R399 Unspecified symptoms and signs involving the genitourinary system: Secondary | ICD-10-CM | POA: Diagnosis not present

## 2018-05-12 DIAGNOSIS — Z113 Encounter for screening for infections with a predominantly sexual mode of transmission: Secondary | ICD-10-CM | POA: Diagnosis not present

## 2018-08-06 DIAGNOSIS — Z113 Encounter for screening for infections with a predominantly sexual mode of transmission: Secondary | ICD-10-CM | POA: Diagnosis not present

## 2018-08-06 DIAGNOSIS — A599 Trichomoniasis, unspecified: Secondary | ICD-10-CM | POA: Diagnosis not present

## 2018-08-14 DIAGNOSIS — E668 Other obesity: Secondary | ICD-10-CM | POA: Diagnosis not present

## 2018-08-14 DIAGNOSIS — Z1389 Encounter for screening for other disorder: Secondary | ICD-10-CM | POA: Diagnosis not present

## 2018-08-14 DIAGNOSIS — R600 Localized edema: Secondary | ICD-10-CM | POA: Diagnosis not present

## 2018-08-14 DIAGNOSIS — R42 Dizziness and giddiness: Secondary | ICD-10-CM | POA: Diagnosis not present

## 2018-08-14 DIAGNOSIS — F418 Other specified anxiety disorders: Secondary | ICD-10-CM | POA: Diagnosis not present

## 2018-08-15 DIAGNOSIS — D509 Iron deficiency anemia, unspecified: Secondary | ICD-10-CM | POA: Insufficient documentation

## 2018-08-18 ENCOUNTER — Other Ambulatory Visit: Payer: Self-pay

## 2018-08-18 DIAGNOSIS — I872 Venous insufficiency (chronic) (peripheral): Secondary | ICD-10-CM

## 2018-09-01 DIAGNOSIS — D649 Anemia, unspecified: Secondary | ICD-10-CM | POA: Diagnosis not present

## 2018-09-23 DIAGNOSIS — N390 Urinary tract infection, site not specified: Secondary | ICD-10-CM | POA: Diagnosis not present

## 2018-09-23 DIAGNOSIS — N920 Excessive and frequent menstruation with regular cycle: Secondary | ICD-10-CM | POA: Diagnosis not present

## 2018-09-23 DIAGNOSIS — Z113 Encounter for screening for infections with a predominantly sexual mode of transmission: Secondary | ICD-10-CM | POA: Diagnosis not present

## 2018-09-23 DIAGNOSIS — N760 Acute vaginitis: Secondary | ICD-10-CM | POA: Diagnosis not present

## 2018-09-23 DIAGNOSIS — R82998 Other abnormal findings in urine: Secondary | ICD-10-CM | POA: Diagnosis not present

## 2018-10-24 DIAGNOSIS — N920 Excessive and frequent menstruation with regular cycle: Secondary | ICD-10-CM | POA: Diagnosis not present

## 2018-10-24 DIAGNOSIS — Z1231 Encounter for screening mammogram for malignant neoplasm of breast: Secondary | ICD-10-CM | POA: Diagnosis not present

## 2018-10-24 DIAGNOSIS — Z131 Encounter for screening for diabetes mellitus: Secondary | ICD-10-CM | POA: Diagnosis not present

## 2018-10-24 DIAGNOSIS — N760 Acute vaginitis: Secondary | ICD-10-CM | POA: Diagnosis not present

## 2018-10-24 DIAGNOSIS — D509 Iron deficiency anemia, unspecified: Secondary | ICD-10-CM | POA: Diagnosis not present

## 2018-10-24 DIAGNOSIS — D649 Anemia, unspecified: Secondary | ICD-10-CM | POA: Diagnosis not present

## 2018-10-24 DIAGNOSIS — Z6830 Body mass index (BMI) 30.0-30.9, adult: Secondary | ICD-10-CM | POA: Diagnosis not present

## 2018-10-24 DIAGNOSIS — Z01419 Encounter for gynecological examination (general) (routine) without abnormal findings: Secondary | ICD-10-CM | POA: Diagnosis not present

## 2018-11-04 ENCOUNTER — Ambulatory Visit: Payer: BLUE CROSS/BLUE SHIELD | Admitting: Vascular Surgery

## 2018-11-04 ENCOUNTER — Other Ambulatory Visit: Payer: Self-pay

## 2018-11-04 ENCOUNTER — Ambulatory Visit (HOSPITAL_COMMUNITY)
Admission: RE | Admit: 2018-11-04 | Discharge: 2018-11-04 | Disposition: A | Discharge status: Home / Self Care | Payer: BLUE CROSS/BLUE SHIELD | Source: Ambulatory Visit | Attending: Internal Medicine | Admitting: Internal Medicine

## 2018-11-04 ENCOUNTER — Encounter: Payer: Self-pay | Admitting: Vascular Surgery

## 2018-11-04 VITALS — BP 101/63 | HR 62 | Temp 98.2°F | Resp 20 | Ht 66.25 in | Wt 187.0 lb

## 2018-11-04 DIAGNOSIS — I872 Venous insufficiency (chronic) (peripheral): Secondary | ICD-10-CM

## 2018-11-04 NOTE — Progress Notes (Signed)
[facility] [place]  Patient name: Maria Duran MRN: [id number] DOB: 03/23/1976 Sex: female  REASON FOR CONSULT: Evaluation of swelling in the left lower extremity  HPI: Maria Duran is a 42 y.o. female, who is seen today for evaluation of swelling in her left lower extremity.  She has no history of DVT.  She noticed that she has minimal swelling when first arising in the morning but this is progressive throughout the day.  She also notes that it is worse in the summertime.  She had attempted a trial of diuretic with minimal change from this.  No history of venous varicosities.  Past Medical History:  Diagnosis Date  . Acne rosacea   . Esophageal reflux   . Family history of malignant neoplasm of gastrointestinal tract   . Internal hemorrhoids without mention of complication   . Personal history of other diseases of digestive system   . Polycystic ovaries   . Vestibular dysfunction    Mal de Debarquement syndrome     Family History  Problem Relation Age of Onset  . Colon cancer Other        1st Degree Relative <60  . Hyperlipidemia Other   . Hypertension Other   . Crohn's disease Other   . Alzheimer's disease Father     SOCIAL HISTORY: Social History   Socioeconomic History  . Marital status: Divorced    Spouse name: Not on file  . Number of children: Not on file  . Years of education: Not on file  . Highest education level: Not on file  Occupational History  . Not on file  Social Needs  . Financial resource strain: Not on file  . Food insecurity:    Worry: Not on file    Inability: Not on file  . Transportation needs:    Medical: Not on file    Non-medical: Not on file  Tobacco Use  . Smoking status: Never Smoker  . Smokeless tobacco: Never Used  Substance and Sexual Activity  . Alcohol use: Yes    Alcohol/week: 0.0 standard drinks    Comment: occ  . Drug use: No  . Sexual activity: Not on file   Lifestyle  . Physical activity:    Days per week: Not on file    Minutes per session: Not on file  . Stress: Not on file  Relationships  . Social connections:    Talks on phone: Not on file    Gets together: Not on file    Attends religious service: Not on file    Active member of club or organization: Not on file    Attends meetings of clubs or organizations: Not on file    Relationship status: Not on file  . Intimate partner violence:    Fear of current or ex partner: Not on file    Emotionally abused: Not on file    Physically abused: Not on file    Forced sexual activity: Not on file  Other Topics Concern  . 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 Erythromycin Ethylsuccinate     REACTION: rash    Current Outpatient Medications  Medication Sig Dispense Refill  . furosemide (LASIX) 20 MG tablet Take 20 mg by  mouth daily as needed.    . Iron Combinations (IRON COMPLEX PO) Take by mouth.    Marland Kitchen. MAGNESIUM PO Take 500 mg by mouth daily.    . Prenatal Multivit-Min-Fe-FA (PRE-NATAL PO) Take by mouth.    Marland Kitchen. aspirin 81 MG tablet Take 81 mg by mouth daily.    . Cholecalciferol (VITAMIN D3) 5000 units CAPS Take by mouth daily.    . clonazePAM (KLONOPIN) 0.5 MG tablet take 1 tablet by mouth every 8 hours if needed 270 tablet 1  . Cyanocobalamin (VITAMIN B-12) 2500 MCG SUBL Vitamin B12 2,500 mcg tablet   1 tablet every day by oral route.    Marland Kitchen. omeprazole (PRILOSEC) 40 MG capsule Take 1 capsule (40 mg total) by mouth daily. 90 capsule 3  . sertraline (ZOLOFT) 50 MG tablet Take 1 tablet (50 mg total) by mouth daily. 90 tablet 3   No current facility-administered medications for this visit.     REVIEW OF SYSTEMS:  [X]  denotes positive finding, [ ]  denotes negative finding Cardiac  Comments:  Chest pain or chest pressure:      Shortness of breath upon exertion:    Short of breath when lying flat:    Irregular heart rhythm:        Vascular    Pain in calf, thigh, or hip brought on by ambulation:    Pain in feet at night that wakes you up from your sleep:     Blood clot in your veins: x   Leg swelling:  x       Pulmonary    Oxygen at home:    Productive cough:     Wheezing:         Neurologic    Sudden weakness in arms or legs:     Sudden numbness in arms or legs:  x   Sudden onset of difficulty speaking or slurred speech:    Temporary loss of vision in one eye:     Problems with dizziness:  x       Gastrointestinal    Blood in stool:     Vomited blood:         Genitourinary    Burning when urinating:     Blood in urine:        Psychiatric    Major depression:         Hematologic    Bleeding problems:    Problems with blood clotting too easily:        Skin    Rashes or ulcers:        Constitutional    Fever or chills:      PHYSICAL EXAM: Vitals:   11/04/18 1105  BP: 101/63  Pulse: 62  Resp: 20  Temp: 98.2 F (36.8 C)  SpO2: 97%  Weight: 187 lb (84.8 kg)  Height: 5' 6.25" (1.683 m)    GENERAL: The patient is a well-nourished female, in no acute distress. The vital signs are documented above. CARDIOVASCULAR: 2+ dorsalis pedis pulses bilaterally PULMONARY: There is good air exchange  ABDOMEN: Soft and non-tender  MUSCULOSKELETAL: There are no major deformities or cyanosis. NEUROLOGIC: No focal weakness or paresthesias are detected. SKIN: There are no ulcers or rashes noted.  Very few scattered telangiectasia on her lower extremities PSYCHIATRIC: The patient has a normal affect.  DATA:  Lower extremity duplex and reflux study today revealed no evidence of DVT or venous obstruction.  She has no evidence of deep venous reflux.  She does  have reflux at her saphenofemoral junction bilaterally.  On the left reflux is limited only to the saphenofemoral junction.  She does have some  reflux in her right great saphenous vein through the proximal thigh.  MEDICAL ISSUES: I discussed these findings at length with the patient.  Explained that she does not have any issues that would put her in any increased risk for DVT.  Since this is unilateral leg swelling suspect that it is not related to overall fluid overload.  She does not have any correctable superficial reflux on the left leg.  I did explain the importance of elevation and compression.  She does report that the swelling can extend down onto the dorsum of her foot and her toes.  I explained that this is more consistent with potential lymphedema as well.  Explained that the treatment would be knee-high compression and 20 to 30 mmHg.  I explained that the most important reason to initiate this now is to slow the expected overall progression lifelong of the swelling.  She was pleased with this discussion and will see [place] again on an as-needed basis   Larina Earthly, MD [facility] Tel [phone] Pager [phone]

## 2018-12-11 DIAGNOSIS — Z113 Encounter for screening for infections with a predominantly sexual mode of transmission: Secondary | ICD-10-CM | POA: Diagnosis not present

## 2018-12-11 DIAGNOSIS — N760 Acute vaginitis: Secondary | ICD-10-CM | POA: Diagnosis not present

## 2019-02-16 DIAGNOSIS — E7849 Other hyperlipidemia: Secondary | ICD-10-CM | POA: Diagnosis not present

## 2019-02-16 DIAGNOSIS — R7989 Other specified abnormal findings of blood chemistry: Secondary | ICD-10-CM | POA: Diagnosis not present

## 2019-02-17 DIAGNOSIS — R82998 Other abnormal findings in urine: Secondary | ICD-10-CM | POA: Diagnosis not present

## 2019-02-23 DIAGNOSIS — R740 Nonspecific elevation of levels of transaminase and lactic acid dehydrogenase [LDH]: Secondary | ICD-10-CM | POA: Diagnosis not present

## 2019-02-23 DIAGNOSIS — N920 Excessive and frequent menstruation with regular cycle: Secondary | ICD-10-CM | POA: Diagnosis not present

## 2019-02-23 DIAGNOSIS — D509 Iron deficiency anemia, unspecified: Secondary | ICD-10-CM | POA: Diagnosis not present

## 2019-02-23 DIAGNOSIS — R42 Dizziness and giddiness: Secondary | ICD-10-CM | POA: Diagnosis not present

## 2019-02-23 DIAGNOSIS — Z0000 Encounter for general adult medical examination without abnormal findings: Secondary | ICD-10-CM | POA: Diagnosis not present

## 2019-07-20 ENCOUNTER — Emergency Department (HOSPITAL_COMMUNITY): Payer: BC Managed Care – PPO | Admitting: Certified Registered Nurse Anesthetist

## 2019-07-20 ENCOUNTER — Other Ambulatory Visit: Payer: Self-pay

## 2019-07-20 ENCOUNTER — Emergency Department (HOSPITAL_COMMUNITY): Payer: BC Managed Care – PPO

## 2019-07-20 ENCOUNTER — Observation Stay (HOSPITAL_COMMUNITY)
Admission: EM | Admit: 2019-07-20 | Discharge: 2019-07-21 | Disposition: A | Discharge status: Home / Self Care | Payer: BC Managed Care – PPO | Attending: Surgery | Admitting: Surgery

## 2019-07-20 ENCOUNTER — Encounter (HOSPITAL_COMMUNITY)
Admission: EM | Disposition: A | Discharge status: Home / Self Care | Payer: Self-pay | Source: Home / Self Care | Attending: Emergency Medicine

## 2019-07-20 DIAGNOSIS — Z20828 Contact with and (suspected) exposure to other viral communicable diseases: Secondary | ICD-10-CM | POA: Insufficient documentation

## 2019-07-20 DIAGNOSIS — K3580 Unspecified acute appendicitis: Secondary | ICD-10-CM | POA: Diagnosis not present

## 2019-07-20 DIAGNOSIS — Z881 Allergy status to other antibiotic agents status: Secondary | ICD-10-CM | POA: Diagnosis not present

## 2019-07-20 DIAGNOSIS — J018 Other acute sinusitis: Secondary | ICD-10-CM | POA: Diagnosis not present

## 2019-07-20 DIAGNOSIS — E282 Polycystic ovarian syndrome: Secondary | ICD-10-CM | POA: Diagnosis not present

## 2019-07-20 DIAGNOSIS — K37 Unspecified appendicitis: Secondary | ICD-10-CM | POA: Diagnosis not present

## 2019-07-20 DIAGNOSIS — N200 Calculus of kidney: Secondary | ICD-10-CM | POA: Insufficient documentation

## 2019-07-20 DIAGNOSIS — K219 Gastro-esophageal reflux disease without esophagitis: Secondary | ICD-10-CM | POA: Insufficient documentation

## 2019-07-20 DIAGNOSIS — J309 Allergic rhinitis, unspecified: Secondary | ICD-10-CM | POA: Diagnosis not present

## 2019-07-20 DIAGNOSIS — Z9049 Acquired absence of other specified parts of digestive tract: Secondary | ICD-10-CM

## 2019-07-20 DIAGNOSIS — Z79899 Other long term (current) drug therapy: Secondary | ICD-10-CM | POA: Diagnosis not present

## 2019-07-20 DIAGNOSIS — R197 Diarrhea, unspecified: Secondary | ICD-10-CM | POA: Diagnosis not present

## 2019-07-20 DIAGNOSIS — R1110 Vomiting, unspecified: Secondary | ICD-10-CM | POA: Diagnosis not present

## 2019-07-20 HISTORY — PX: LAPAROSCOPIC APPENDECTOMY: SHX408

## 2019-07-20 LAB — CBC
HCT: 40 % (ref 36.0–46.0)
Hemoglobin: 12.8 g/dL (ref 12.0–15.0)
MCH: 27.7 pg (ref 26.0–34.0)
MCHC: 32 g/dL (ref 30.0–36.0)
MCV: 86.6 fL (ref 80.0–100.0)
Platelets: 323 10*3/uL (ref 150–400)
RBC: 4.62 MIL/uL (ref 3.87–5.11)
RDW: 15 % (ref 11.5–15.5)
WBC: 17 10*3/uL — ABNORMAL HIGH (ref 4.0–10.5)
nRBC: 0 % (ref 0.0–0.2)

## 2019-07-20 LAB — COMPREHENSIVE METABOLIC PANEL
ALT: 11 U/L (ref 0–44)
AST: 15 U/L (ref 15–41)
Albumin: 4.1 g/dL (ref 3.5–5.0)
Alkaline Phosphatase: 56 U/L (ref 38–126)
Anion gap: 10 (ref 5–15)
BUN: 16 mg/dL (ref 6–20)
CO2: 23 mmol/L (ref 22–32)
Calcium: 9.5 mg/dL (ref 8.9–10.3)
Chloride: 104 mmol/L (ref 98–111)
Creatinine, Ser: 0.61 mg/dL (ref 0.44–1.00)
GFR calc Af Amer: >60 mL/min (ref >60)
GFR calc non Af Amer: >60 mL/min (ref >60)
Glucose, Bld: 134 mg/dL — ABNORMAL HIGH (ref 70–99)
Potassium: 3.5 mmol/L (ref 3.5–5.1)
Sodium: 137 mmol/L (ref 135–145)
Total Bilirubin: 0.6 mg/dL (ref 0.3–1.2)
Total Protein: 7 g/dL (ref 6.5–8.1)

## 2019-07-20 LAB — SARS CORONAVIRUS 2 BY RT PCR (HOSPITAL ORDER, PERFORMED IN ~~LOC~~ HOSPITAL LAB): SARS Coronavirus 2: NEGATIVE

## 2019-07-20 LAB — URINALYSIS, ROUTINE W REFLEX MICROSCOPIC
Bilirubin Urine: NEGATIVE
Glucose, UA: NEGATIVE mg/dL
Ketones, ur: 20 mg/dL — AB
Leukocytes,Ua: NEGATIVE
Nitrite: NEGATIVE
Protein, ur: NEGATIVE mg/dL
Specific Gravity, Urine: 1.027 (ref 1.005–1.030)
pH: 5 (ref 5.0–8.0)

## 2019-07-20 LAB — LIPASE, BLOOD: Lipase: 20 U/L (ref 11–51)

## 2019-07-20 LAB — I-STAT BETA HCG BLOOD, ED (MC, WL, AP ONLY): I-stat hCG, quantitative: <5 m[IU]/mL (ref <5)

## 2019-07-20 SURGERY — APPENDECTOMY, LAPAROSCOPIC
Anesthesia: General | Site: Abdomen

## 2019-07-20 MED ORDER — LIDOCAINE HCL (CARDIAC) PF 100 MG/5ML IV SOSY
PREFILLED_SYRINGE | INTRAVENOUS | Status: DC | PRN
  Administered 2019-07-20: 60 mg via INTRATRACHEAL

## 2019-07-20 MED ORDER — SODIUM CHLORIDE 0.9 % IV SOLN
2.0000 g | Freq: Once | INTRAVENOUS | Status: AC
  Administered 2019-07-20: 2 g via INTRAVENOUS
  Filled 2019-07-20: qty 20

## 2019-07-20 MED ORDER — SODIUM CHLORIDE 0.9 % IV BOLUS
1000.0000 mL | Freq: Once | INTRAVENOUS | Status: AC
  Administered 2019-07-20: 1000 mL via INTRAVENOUS

## 2019-07-20 MED ORDER — SUCCINYLCHOLINE CHLORIDE 20 MG/ML IJ SOLN
INTRAMUSCULAR | Status: DC | PRN
  Administered 2019-07-20: 100 mg via INTRAVENOUS

## 2019-07-20 MED ORDER — BUPIVACAINE-EPINEPHRINE (PF) 0.25% -1:200000 IJ SOLN
INTRAMUSCULAR | Status: AC
  Filled 2019-07-20: qty 30

## 2019-07-20 MED ORDER — FENTANYL CITRATE (PF) 100 MCG/2ML IJ SOLN
25.0000 ug | Freq: Once | INTRAMUSCULAR | Status: AC
  Administered 2019-07-20: 25 ug via INTRAVENOUS
  Filled 2019-07-20: qty 2

## 2019-07-20 MED ORDER — MIDAZOLAM HCL 2 MG/2ML IJ SOLN
INTRAMUSCULAR | Status: AC
  Filled 2019-07-20: qty 2

## 2019-07-20 MED ORDER — ROCURONIUM BROMIDE 100 MG/10ML IV SOLN
INTRAVENOUS | Status: DC | PRN
  Administered 2019-07-20: 40 mg via INTRAVENOUS

## 2019-07-20 MED ORDER — IOHEXOL 300 MG/ML  SOLN
100.0000 mL | Freq: Once | INTRAMUSCULAR | Status: AC | PRN
  Administered 2019-07-20: 100 mL via INTRAVENOUS

## 2019-07-20 MED ORDER — PROPOFOL 10 MG/ML IV BOLUS
INTRAVENOUS | Status: AC
  Filled 2019-07-20: qty 40

## 2019-07-20 MED ORDER — ONDANSETRON 4 MG PO TBDP
4.0000 mg | ORAL_TABLET | Freq: Once | ORAL | Status: AC | PRN
  Administered 2019-07-20: 4 mg via ORAL
  Filled 2019-07-20: qty 1

## 2019-07-20 MED ORDER — LACTATED RINGERS IV SOLN
INTRAVENOUS | Status: DC | PRN
  Administered 2019-07-20 – 2019-07-21 (×2): via INTRAVENOUS

## 2019-07-20 MED ORDER — FENTANYL CITRATE (PF) 250 MCG/5ML IJ SOLN
INTRAMUSCULAR | Status: DC | PRN
  Administered 2019-07-20 (×2): 50 ug via INTRAVENOUS
  Administered 2019-07-20: 100 ug via INTRAVENOUS
  Administered 2019-07-21: 50 ug via INTRAVENOUS

## 2019-07-20 MED ORDER — KETOROLAC TROMETHAMINE 15 MG/ML IJ SOLN
15.0000 mg | Freq: Once | INTRAMUSCULAR | Status: AC
  Administered 2019-07-20: 15 mg via INTRAVENOUS
  Filled 2019-07-20: qty 1

## 2019-07-20 MED ORDER — HYDROMORPHONE HCL 1 MG/ML IJ SOLN
0.5000 mg | Freq: Once | INTRAMUSCULAR | Status: AC
  Administered 2019-07-20: 0.5 mg via INTRAVENOUS
  Filled 2019-07-20: qty 1

## 2019-07-20 MED ORDER — METRONIDAZOLE IN NACL 5-0.79 MG/ML-% IV SOLN
500.0000 mg | Freq: Once | INTRAVENOUS | Status: AC
  Administered 2019-07-20: 500 mg via INTRAVENOUS
  Filled 2019-07-20: qty 100

## 2019-07-20 MED ORDER — PHENYLEPHRINE HCL (PRESSORS) 10 MG/ML IV SOLN
INTRAVENOUS | Status: DC | PRN
  Administered 2019-07-20: 40 ug via INTRAVENOUS

## 2019-07-20 MED ORDER — MIDAZOLAM HCL 5 MG/5ML IJ SOLN
INTRAMUSCULAR | Status: DC | PRN
  Administered 2019-07-20: 2 mg via INTRAVENOUS

## 2019-07-20 MED ORDER — PROPOFOL 10 MG/ML IV BOLUS
INTRAVENOUS | Status: DC | PRN
  Administered 2019-07-20: 200 mg via INTRAVENOUS

## 2019-07-20 MED ORDER — SODIUM CHLORIDE 0.9% FLUSH
3.0000 mL | Freq: Once | INTRAVENOUS | Status: AC
  Administered 2019-07-20: 3 mL via INTRAVENOUS

## 2019-07-20 MED ORDER — FENTANYL CITRATE (PF) 250 MCG/5ML IJ SOLN
INTRAMUSCULAR | Status: AC
  Filled 2019-07-20: qty 5

## 2019-07-20 SURGICAL SUPPLY — 49 items
ADH SKN CLS APL DERMABOND .7 (GAUZE/BANDAGES/DRESSINGS) ×1
APL PRP STRL LF DISP 70% ISPRP (MISCELLANEOUS) ×1
APPLIER CLIP ROT 10 11.4 M/L (STAPLE)
APR CLP MED LRG 11.4X10 (STAPLE)
BAG SPEC RTRVL 10 TROC 200 (ENDOMECHANICALS) ×1
CANISTER SUCT 3000ML PPV (MISCELLANEOUS) ×2 IMPLANT
CHLORAPREP W/TINT 26 (MISCELLANEOUS) ×2 IMPLANT
CLIP APPLIE ROT 10 11.4 M/L (STAPLE) IMPLANT
COVER SURGICAL LIGHT HANDLE (MISCELLANEOUS) ×2 IMPLANT
COVER WAND RF STERILE (DRAPES) ×1 IMPLANT
CUTTER FLEX LINEAR 45M (STAPLE) ×2 IMPLANT
DERMABOND ADVANCED (GAUZE/BANDAGES/DRESSINGS) ×1
DERMABOND ADVANCED .7 DNX12 (GAUZE/BANDAGES/DRESSINGS) ×1 IMPLANT
ELECT REM PT RETURN 9FT ADLT (ELECTROSURGICAL) ×2
ELECTRODE REM PT RTRN 9FT ADLT (ELECTROSURGICAL) ×1 IMPLANT
GLOVE BIO SURGEON STRL SZ7 (GLOVE) ×2 IMPLANT
GLOVE BIOGEL PI IND STRL 7.5 (GLOVE) ×1 IMPLANT
GLOVE BIOGEL PI IND STRL 8 (GLOVE) IMPLANT
GLOVE BIOGEL PI INDICATOR 7.5 (GLOVE) ×1
GLOVE BIOGEL PI INDICATOR 8 (GLOVE) ×1
GOWN STRL REUS W/ TWL LRG LVL3 (GOWN DISPOSABLE) ×3 IMPLANT
GOWN STRL REUS W/TWL LRG LVL3 (GOWN DISPOSABLE) ×4
GRASPER SUT TROCAR 14GX15 (MISCELLANEOUS) ×2 IMPLANT
KIT BASIN OR (CUSTOM PROCEDURE TRAY) ×2 IMPLANT
KIT TURNOVER KIT B (KITS) ×2 IMPLANT
NS IRRIG 1000ML POUR BTL (IV SOLUTION) ×2 IMPLANT
PAD ARMBOARD 7.5X6 YLW CONV (MISCELLANEOUS) ×3 IMPLANT
POUCH RETRIEVAL ECOSAC 10 (ENDOMECHANICALS) ×1 IMPLANT
POUCH RETRIEVAL ECOSAC 10MM (ENDOMECHANICALS) ×1
RELOAD 45 VASCULAR/THIN (ENDOMECHANICALS) IMPLANT
RELOAD STAPLE 45 2.5 WHT GRN (ENDOMECHANICALS) IMPLANT
RELOAD STAPLE 45 3.5 BLU ETS (ENDOMECHANICALS) IMPLANT
RELOAD STAPLE TA45 3.5 REG BLU (ENDOMECHANICALS) ×2 IMPLANT
SCISSORS LAP 5X35 DISP (ENDOMECHANICALS) IMPLANT
SET IRRIG TUBING LAPAROSCOPIC (IRRIGATION / IRRIGATOR) ×2 IMPLANT
SET TUBE SMOKE EVAC HIGH FLOW (TUBING) ×2 IMPLANT
SHEARS HARMONIC ACE PLUS 36CM (ENDOMECHANICALS) ×2 IMPLANT
SLEEVE ENDOPATH XCEL 5M (ENDOMECHANICALS) ×2 IMPLANT
SPECIMEN JAR SMALL (MISCELLANEOUS) ×2 IMPLANT
STRIP CLOSURE SKIN 1/2X4 (GAUZE/BANDAGES/DRESSINGS) ×2 IMPLANT
SUT MNCRL AB 4-0 PS2 18 (SUTURE) ×2 IMPLANT
SUT VICRYL 0 UR6 27IN ABS (SUTURE) ×2 IMPLANT
TOWEL GREEN STERILE (TOWEL DISPOSABLE) ×2 IMPLANT
TOWEL GREEN STERILE FF (TOWEL DISPOSABLE) ×1 IMPLANT
TRAY FOLEY MTR SLVR 16FR STAT (SET/KITS/TRAYS/PACK) IMPLANT
TRAY LAPAROSCOPIC MC (CUSTOM PROCEDURE TRAY) ×2 IMPLANT
TROCAR XCEL BLUNT TIP 100MML (ENDOMECHANICALS) ×2 IMPLANT
TROCAR XCEL NON-BLD 5MMX100MML (ENDOMECHANICALS) ×2 IMPLANT
WATER STERILE IRR 1000ML POUR (IV SOLUTION) ×2 IMPLANT

## 2019-07-20 NOTE — ED Provider Notes (Signed)
[facility] Department Provider Note MRN:  [id number]  Arrival date & time: 07/20/19     Chief Complaint   Abdominal pain History of Present Illness   Maria Duran is a 43 y.o. year-old female with no pertinent past medical history presenting to the ED with chief complaint of abdominal pain.  Location: Right lower quadrant Duration: 3 days Onset: Gradual Timing: Constant Description: Sharp Severity: Mild Exacerbating/Alleviating Factors: None Associated Symptoms: Nausea, nonbloody nonbilious emesis, nonbloody diarrhea Pertinent Negatives: Denies fever, no chest pain or shortness of breath, no vaginal bleeding or discharge.   Review of Systems  A complete 10 system review of systems was obtained and all systems are negative except as noted in the HPI and PMH.   Patient's Health History    Past Medical History:  Diagnosis Date  . Acne rosacea   . Esophageal reflux   . Family history of malignant neoplasm of gastrointestinal tract   . Internal hemorrhoids without mention of complication   . Personal history of other diseases of digestive system   . Polycystic ovaries   . Vestibular dysfunction    Mal de Debarquement syndrome     Past Surgical History:  Procedure Laterality Date  . colonoscopy    . ESOPHAGOGASTRODUODENOSCOPY      Family History  Problem Relation Age of Onset  . Colon cancer Other        1st Degree Relative <60  . Hyperlipidemia Other   . Hypertension Other   . Crohn's disease Other   . Alzheimer's disease Father     Social History   Socioeconomic History  . Marital status: Divorced    Spouse name: Not on file  . Number of children: Not on file  . Years of education: Not on file  . Highest education level: Not on file  Occupational History  . Not on file  Social Needs  . Financial resource strain: Not on file  . Food insecurity    Worry: Not on file    Inability: Not on file  . Transportation needs     Medical: Not on file    Non-medical: Not on file  Tobacco Use  . Smoking status: Never Smoker  . Smokeless tobacco: Never Used  Substance and Sexual Activity  . Alcohol use: Yes    Alcohol/week: 0.0 standard drinks    Comment: occ  . Drug use: No  . Sexual activity: Not on file  Lifestyle  . Physical activity    Days per week: Not on file    Minutes per session: Not on file  . Stress: Not on file  Relationships  . Social Musicianconnections    Talks on phone: Not on file    Gets together: Not on file    Attends religious service: Not on file    Active member of club or organization: Not on file    Attends meetings of clubs or organizations: Not on file    Relationship status: Not on file  . Intimate partner violence    Fear of current or ex partner: Not on file    Emotionally abused: Not on file    Physically abused: Not on file    Forced sexual activity: Not on file  Other Topics Concern  . 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 [employer] and little   1 alcoholic beverage a day   No caffeine  Physical Exam  Vital Signs and Nursing Notes reviewed Vitals:   07/20/19 2038 07/20/19 2045  BP:  128/73  Pulse: 82 71  Resp: 16 (!) 21  Temp:    SpO2: 100% 98%    CONSTITUTIONAL: Well-appearing, NAD NEURO:  Alert and oriented x 3, no focal deficits EYES:  eyes equal and reactive ENT/NECK:  no LAD, no JVD CARDIO: Regular rate, well-perfused, normal S1 and S2 PULM:  CTAB no wheezing or rhonchi GI/GU:  normal bowel sounds, non-distended, moderate right lower quadrant tenderness to palpation MSK/SPINE:  No gross deformities, no edema SKIN:  no rash, atraumatic PSYCH:  Appropriate speech and behavior  Diagnostic and Interventional Summary    Labs Reviewed  COMPREHENSIVE METABOLIC PANEL - Abnormal; Notable for the following components:      Result Value   Glucose, Bld 134 (*)    All other components within normal  limits  CBC - Abnormal; Notable for the following components:   WBC 17.0 (*)    All other components within normal limits  URINALYSIS, ROUTINE W REFLEX MICROSCOPIC - Abnormal; Notable for the following components:   Hgb urine dipstick SMALL (*)    Ketones, ur 20 (*)    Bacteria, UA RARE (*)    All other components within normal limits  SARS CORONAVIRUS 2 (TAT 6-24 HRS)  LIPASE, BLOOD  I-STAT BETA HCG BLOOD, ED ([facility], [facility], [facility] ONLY)    CT ABDOMEN PELVIS W CONTRAST  Final Result      Medications  cefTRIAXone (ROCEPHIN) 2 g in sodium chloride 0.9 % 100 mL IVPB (2 g Intravenous New Bag/Given 07/20/19 2036)  metroNIDAZOLE (FLAGYL) IVPB 500 mg (500 mg Intravenous New Bag/Given 07/20/19 2037)  sodium chloride flush (NS) 0.9 % injection 3 mL (3 mLs Intravenous Given 07/20/19 2013)  ondansetron (ZOFRAN-ODT) disintegrating tablet 4 mg (4 mg Oral Given 07/20/19 1443)  ketorolac (TORADOL) 15 MG/ML injection 15 mg (15 mg Intravenous Given 07/20/19 1845)  fentaNYL (SUBLIMAZE) injection 25 mcg (25 mcg Intravenous Given 07/20/19 1846)  sodium chloride 0.9 % bolus 1,000 mL (1,000 mLs Intravenous New Bag/Given 07/20/19 1858)  iohexol (OMNIPAQUE) 300 MG/ML solution 100 mL (100 mLs Intravenous Contrast Given 07/20/19 1921)     Procedures Critical Care Critical Care Documentation Critical care time provided by me (excluding procedures): 33 minutes  Condition necessitating critical care: Acute appendicitis  Components of critical care management: reviewing of prior records, laboratory and imaging interpretation, frequent re-examination and reassessment of vital signs, administration of IV fluids, IV antibiotics, discussion with consulting services.    ED Course and Medical Decision Making  I have reviewed the triage vital signs and the nursing notes.  Pertinent labs & imaging results that were available during my care of the patient were reviewed by me and considered in my medical decision making (see below  for details).  Concern for appendicitis, CT to exclude.  Also considering colitis given the associated symptom of diarrhea.  Patient is without vaginal bleeding or discharge, no STD history, considered low risk for pelvic etiology.  CT confirms appendicitis, provided with antibiotics, to be managed by general surgery.  Elmer Sow. Pilar Plate, MD [facility] Emergency Medicine [facility] [email or web address]  Final Clinical Impressions(s) / ED Diagnoses     ICD-10-CM   1. Acute appendicitis, unspecified acute appendicitis type  K35.80     ED Discharge Orders    None      Discharge Instructions Discussed with and Provided to Patient: Discharge Instructions   None  Maudie Flakes, MD 07/20/19 2056

## 2019-07-20 NOTE — ED Triage Notes (Signed)
Pt here w/ co N/V/D. Pt started having abd pain Friday along with some diarrhea. Pt states that today she has had 3 episodes of vomiting and 5 loose stools. Pt denies taking anything to relieve the nvd. Denies chest pain. Denies fevers at home.

## 2019-07-20 NOTE — ED Notes (Signed)
Dr. Wakefield in to assess pt at this time 

## 2019-07-20 NOTE — Anesthesia Procedure Notes (Signed)
Procedure Name: Intubation Date/Time: 07/20/2019 11:57 PM Performed by: Clovis Cao, CRNA Pre-anesthesia Checklist: Patient identified, Emergency Drugs available, Suction available, Patient being monitored and Timeout performed Patient Re-evaluated:Patient Re-evaluated prior to induction Oxygen Delivery Method: Circle system utilized Preoxygenation: Pre-oxygenation with 100% oxygen Induction Type: IV induction, Rapid sequence and Cricoid Pressure applied Laryngoscope Size: Miller and 2 Grade View: Grade I Tube type: Oral Tube size: 7.0 mm Number of attempts: 1 Airway Equipment and Method: Stylet Placement Confirmation: ETT inserted through vocal cords under direct vision,  positive ETCO2 and breath sounds checked- equal and bilateral Secured at: 21 cm Tube secured with: Tape Dental Injury: Teeth and Oropharynx as per pre-operative assessment

## 2019-07-20 NOTE — ED Notes (Signed)
Informed consent sent with pt to short stay

## 2019-07-20 NOTE — H&P (Signed)
Maria Duran is an 43 y.o. female.   Chief Complaint: ab pain HPI: 57 yof who presents with diarrhea for several days, has prior endoscopies with polyps due to fh and what she says was uc treated with doxy years ago.  She developed rlq pain this am that has worsened. It is sharp and constant. Nothing making better. Movement makes worse. No fevers. No issues voiding.  She has no psh.  She underwent evaluation in er showing elevated wbc and ct with appendicitis.    Past Medical History:  Diagnosis Date  . Acne rosacea   . Esophageal reflux   . Family history of malignant neoplasm of gastrointestinal tract   . Internal hemorrhoids without mention of complication   . Personal history of other diseases of digestive system   . Polycystic ovaries   . Vestibular dysfunction    Mal de Debarquement syndrome     Past Surgical History:  Procedure Laterality Date  . colonoscopy    . ESOPHAGOGASTRODUODENOSCOPY      Family History  Problem Relation Age of Onset  . Colon cancer Other        1st Degree Relative <60  . Hyperlipidemia Other   . Hypertension Other   . Crohn's disease Other   . Alzheimer's disease Father    Social History:  reports that she has never smoked. She has never used smokeless tobacco. She reports current alcohol use. She reports that she does not use drugs.  Allergies:  Allergies  Allergen Reactions  . Erythromycin Ethylsuccinate     REACTION: rash   meds reviewed   Results for orders placed or performed during the hospital encounter of 07/20/19 (from the past 48 hour(s))  Lipase, blood     Status: None   Collection Time: 07/20/19  2:52 PM  Result Value Ref Range   Lipase 20 11 - 51 U/L    Comment: Performed at [facility], [place] [address], [place], [place] [address]  Comprehensive metabolic panel     Status: Abnormal   Collection Time: 07/20/19  2:52 PM  Result Value Ref Range   Sodium 137 135 - 145 mmol/L   Potassium 3.5 3.5 - 5.1 mmol/L    Chloride 104 98 - 111 mmol/L   CO2 23 22 - 32 mmol/L   Glucose, Bld 134 (H) 70 - 99 mg/dL   BUN 16 6 - 20 mg/dL   Creatinine, Ser 0.61 0.44 - 1.00 mg/dL   Calcium 9.5 8.9 - 10.3 mg/dL   Total Protein 7.0 6.5 - 8.1 g/dL   Albumin 4.1 3.5 - 5.0 g/dL   AST 15 15 - 41 U/L   ALT 11 0 - 44 U/L   Alkaline Phosphatase 56 38 - 126 U/L   Total Bilirubin 0.6 0.3 - 1.2 mg/dL   GFR calc non Af Amer >60 >60 mL/min   GFR calc Af Amer >60 >60 mL/min   Anion gap 10 5 - 15    Comment: Performed at [facility], [place] [address] [place], [place] [address]  CBC     Status: Abnormal   Collection Time: 07/20/19  2:52 PM  Result Value Ref Range   WBC 17.0 (H) 4.0 - 10.5 K/uL   RBC 4.62 3.87 - 5.11 MIL/uL   Hemoglobin 12.8 12.0 - 15.0 g/dL   HCT 40.0 36.0 - 46.0 %   MCV 86.6 80.0 - 100.0 fL   MCH 27.7 26.0 - 34.0 pg   MCHC 32.0  30.0 - 36.0 g/dL   RDW 63.8 17.7 - 11.6 %   Platelets 323 150 - 400 K/uL   nRBC 0.0 0.0 - 0.2 %    Comment: Performed at [facility], [address] [place], [place] [address]  I-Stat beta hCG blood, ED     Status: None   Collection Time: 07/20/19  3:17 PM  Result Value Ref Range   I-stat hCG, quantitative <5.0 <5 mIU/mL   Comment 3            Comment:   GEST. AGE      CONC.  (mIU/mL)   <=1 WEEK        5 - 50     2 WEEKS       50 - 500     3 WEEKS       100 - 10,000     4 WEEKS     1,000 - 30,000        FEMALE AND NON-PREGNANT FEMALE:     LESS THAN 5 mIU/mL   Urinalysis, Routine w reflex microscopic     Status: Abnormal   Collection Time: 07/20/19  6:00 PM  Result Value Ref Range   Color, Urine YELLOW YELLOW   APPearance CLEAR CLEAR   Specific Gravity, Urine 1.027 1.005 - 1.030   pH 5.0 5.0 - 8.0   Glucose, UA NEGATIVE NEGATIVE mg/dL   Hgb urine dipstick SMALL (A) NEGATIVE   Bilirubin Urine NEGATIVE NEGATIVE   Ketones, ur 20 (A) NEGATIVE mg/dL   Protein, ur NEGATIVE NEGATIVE mg/dL   Nitrite NEGATIVE NEGATIVE   Leukocytes,Ua NEGATIVE NEGATIVE   RBC  / HPF 0-5 0 - 5 RBC/hpf   WBC, UA 0-5 0 - 5 WBC/hpf   Bacteria, UA RARE (A) NONE SEEN   Squamous Epithelial / LPF 0-5 0 - 5   Mucus PRESENT     Comment: Performed at [facility], [address] [place], [place] [address]   Ct Abdomen Pelvis W Contrast  Result Date: 07/20/2019 CLINICAL DATA:  Right lower quadrant abdominal pain, nausea, vomiting, diarrhea EXAM: CT ABDOMEN AND PELVIS WITH CONTRAST TECHNIQUE: Multidetector CT imaging of the abdomen and pelvis was performed using the standard protocol following bolus administration of intravenous contrast. CONTRAST:  OMNIPAQUE IOHEXOL 300 MG/ML  SOLN COMPARISON:  CT April 10, 2011 FINDINGS: Lower chest: Basilar atelectatic changes. Lung bases otherwise clear. Normal heart size. No pericardial effusion. Hepatobiliary: No focal liver abnormality is seen. No gallstones or pericholecystic inflammation. Mild hyperdense nodular thickening along the body of the gallbladder is similar to prior and likely reflects gallbladder adenomyomatosis. No biliary ductal dilatation. Pancreas: Unremarkable. No pancreatic ductal dilatation or surrounding inflammatory changes. Spleen: Normal in size without focal abnormality. Adrenals/Urinary Tract: Normal adrenal glands. Nonobstructing calculus in the upper pole left kidney. Subcentimeter hypoattenuating foci in the lower pole left kidney too small to fully characterize on CT imaging but statistically likely benign. Kidneys are otherwise unremarkable, without obstructive urolithiasis, suspicious lesion, or hydronephrosis. Bladder is unremarkable. Stomach/Bowel: Distal esophagus, stomach and duodenal sweep are unremarkable. No bowel wall thickening or dilatation. No evidence of obstruction. Mildly dilated and hyperemic appearing appendix measuring 9 mm in maximal diameter with hazy adjacent periappendiceal stranding. No extraluminal gas or adjacent fluid collection. Small fecalith noted at the appendix neck. No colonic  dilatation or wall thickening. Vascular/Lymphatic: The aorta is normal caliber. No suspicious or enlarged lymph nodes in the included lymphatic chains. Reproductive: Small amount of  fluid within the cervical canal, correlate with menstrual status. No concerning adnexal lesions. Other: Right lower quadrant periappendiceal stranding. No free fluid or gas. No organized collection or abscess. No bowel containing hernias. Musculoskeletal: Mild multilevel degenerative changes are present in the imaged portions of the spine. No acute osseous abnormality or suspicious osseous lesion. IMPRESSION: 1. Findings consistent with acute uncomplicated appendicitis with small fecalith at the neck of the appendix. 2. Nonobstructing left nephrolithiasis. 3. Gallbladder adenomyomatosis. 4. Small amount of fluid within the cervical canal, correlate with patient's menstrual status. Electronically Signed   By: Kreg ShropshirePrice  DeHay M.D.   On: 07/20/2019 19:38    Review of Systems  Constitutional: Negative for fever.  Respiratory: Negative for cough and shortness of breath.   Gastrointestinal: Positive for abdominal pain and diarrhea. Negative for nausea and vomiting.  All other systems reviewed and are negative.   Blood pressure 122/77, pulse 65, temperature 98.3 F (36.8 C), resp. rate 12, SpO2 100 %. Physical Exam  Vitals reviewed. Constitutional: She is oriented to person, place, and time. She appears well-developed and well-nourished.  HENT:  Head: Normocephalic and atraumatic.  Eyes: Pupils are equal, round, and reactive to light. No scleral icterus.  Neck: Neck supple.  Cardiovascular: Normal rate, regular rhythm and normal heart sounds.  Respiratory: Effort normal and breath sounds normal.  GI: Soft. Normal appearance and bowel sounds are normal. She exhibits no distension. There is abdominal tenderness in the right lower quadrant.  Neurological: She is alert and oriented to person, place, and time.  Skin: Skin is warm  and dry. She is not diaphoretic.  Psychiatric: She has a normal mood and affect. Her behavior is normal.     Assessment/Plan Appendicitis -discussed abx vs surgery. Recommended surgery due to age as well as fh of cancer.  We discussed lap appy and risk/benefits/recovery -will await covid test due to gi symptoms that I dont think necessarily related to her appendicitis. If negative will proceed.   Emelia LoronMatthew Bristal Steffy, MD 07/20/2019, 8:41 PM

## 2019-07-20 NOTE — Anesthesia Preprocedure Evaluation (Addendum)
Anesthesia Evaluation  Patient identified by MRN, date of birth, ID band Patient awake    Reviewed: Allergy & Precautions, NPO status , Patient's Chart, lab work & pertinent test results  Airway Mallampati: II  TM Distance: >3 FB     Dental   Pulmonary    breath sounds clear to auscultation       Cardiovascular negative cardio ROS   Rhythm:Regular Rate:Normal     Neuro/Psych    GI/Hepatic Neg liver ROS, GERD  ,  Endo/Other    Renal/GU negative Renal ROS     Musculoskeletal   Abdominal   Peds  Hematology   Anesthesia Other Findings   Reproductive/Obstetrics                             Anesthesia Physical Anesthesia Plan  ASA: I  Anesthesia Plan: General   Post-op Pain Management:    Induction: Intravenous  PONV Risk Score and Plan: Ondansetron and Midazolam  Airway Management Planned: Oral ETT  Additional Equipment:   Intra-op Plan:   Post-operative Plan: Extubation in OR  Informed Consent: I have reviewed the patients History and Physical, chart, labs and discussed the procedure including the risks, benefits and alternatives for the proposed anesthesia with the patient or authorized representative who has indicated his/her understanding and acceptance.     Dental advisory given  Plan Discussed with: CRNA, Anesthesiologist and Surgeon  Anesthesia Plan Comments:         Anesthesia Quick Evaluation

## 2019-07-21 ENCOUNTER — Encounter (HOSPITAL_COMMUNITY): Payer: Self-pay | Admitting: Anesthesiology

## 2019-07-21 DIAGNOSIS — K3580 Unspecified acute appendicitis: Secondary | ICD-10-CM | POA: Diagnosis not present

## 2019-07-21 DIAGNOSIS — Z9049 Acquired absence of other specified parts of digestive tract: Secondary | ICD-10-CM

## 2019-07-21 MED ORDER — ACETAMINOPHEN 500 MG PO TABS
1000.0000 mg | ORAL_TABLET | Freq: Four times a day (QID) | ORAL | Status: DC
  Administered 2019-07-21 (×2): 1000 mg via ORAL
  Filled 2019-07-21: qty 2

## 2019-07-21 MED ORDER — ONDANSETRON HCL 4 MG/2ML IJ SOLN
INTRAMUSCULAR | Status: AC
  Filled 2019-07-21: qty 2

## 2019-07-21 MED ORDER — SODIUM CHLORIDE 0.9 % IV SOLN
INTRAVENOUS | Status: DC
  Administered 2019-07-21: 06:00:00 via INTRAVENOUS

## 2019-07-21 MED ORDER — SODIUM CHLORIDE (PF) 0.9 % IJ SOLN
INTRAMUSCULAR | Status: AC
  Filled 2019-07-21: qty 10

## 2019-07-21 MED ORDER — SIMETHICONE 80 MG PO CHEW: 40.0000 mg | CHEWABLE_TABLET | Freq: Four times a day (QID) | ORAL | Status: DC | PRN

## 2019-07-21 MED ORDER — OXYCODONE HCL 5 MG PO TABS: 5.0000 mg | ORAL_TABLET | ORAL | 0 refills | Status: DC | PRN

## 2019-07-21 MED ORDER — ONDANSETRON HCL 4 MG/2ML IJ SOLN
INTRAMUSCULAR | Status: DC | PRN
  Administered 2019-07-21: 4 mg via INTRAVENOUS

## 2019-07-21 MED ORDER — MORPHINE SULFATE (PF) 2 MG/ML IV SOLN
1.0000 mg | INTRAVENOUS | Status: DC | PRN
  Administered 2019-07-21: 1 mg via INTRAVENOUS
  Filled 2019-07-21: qty 1

## 2019-07-21 MED ORDER — KETOROLAC TROMETHAMINE 15 MG/ML IJ SOLN
INTRAMUSCULAR | Status: AC
  Filled 2019-07-21: qty 1

## 2019-07-21 MED ORDER — SODIUM CHLORIDE 0.9 % IR SOLN
Status: DC | PRN
  Administered 2019-07-20: 500 mL

## 2019-07-21 MED ORDER — OXYCODONE HCL 5 MG PO TABS
5.0000 mg | ORAL_TABLET | ORAL | Status: DC | PRN
  Administered 2019-07-21 (×2): 5 mg via ORAL
  Filled 2019-07-21 (×2): qty 1

## 2019-07-21 MED ORDER — SUCCINYLCHOLINE CHLORIDE 200 MG/10ML IV SOSY
PREFILLED_SYRINGE | INTRAVENOUS | Status: AC
  Filled 2019-07-21: qty 10

## 2019-07-21 MED ORDER — PHENYLEPHRINE 40 MCG/ML (10ML) SYRINGE FOR IV PUSH (FOR BLOOD PRESSURE SUPPORT)
PREFILLED_SYRINGE | INTRAVENOUS | Status: AC
  Filled 2019-07-21: qty 10

## 2019-07-21 MED ORDER — ROCURONIUM BROMIDE 10 MG/ML (PF) SYRINGE
PREFILLED_SYRINGE | INTRAVENOUS | Status: AC
  Filled 2019-07-21: qty 10

## 2019-07-21 MED ORDER — PANTOPRAZOLE SODIUM 40 MG PO TBEC
40.0000 mg | DELAYED_RELEASE_TABLET | Freq: Every day | ORAL | Status: DC
  Administered 2019-07-21: 40 mg via ORAL
  Filled 2019-07-21: qty 1

## 2019-07-21 MED ORDER — ONDANSETRON 4 MG PO TBDP: 4.0000 mg | ORAL_TABLET | Freq: Four times a day (QID) | ORAL | Status: DC | PRN

## 2019-07-21 MED ORDER — ACETAMINOPHEN 500 MG PO TABS
ORAL_TABLET | ORAL | Status: AC
  Filled 2019-07-21: qty 2

## 2019-07-21 MED ORDER — KETOROLAC TROMETHAMINE 15 MG/ML IJ SOLN
15.0000 mg | Freq: Four times a day (QID) | INTRAMUSCULAR | Status: DC | PRN
  Administered 2019-07-21: 15 mg via INTRAVENOUS

## 2019-07-21 MED ORDER — ENOXAPARIN SODIUM 40 MG/0.4ML ~~LOC~~ SOLN: 40.0000 mg | SUBCUTANEOUS | Status: DC

## 2019-07-21 MED ORDER — LIDOCAINE 2% (20 MG/ML) 5 ML SYRINGE
INTRAMUSCULAR | Status: AC
  Filled 2019-07-21: qty 10

## 2019-07-21 MED ORDER — ONDANSETRON HCL 4 MG/2ML IJ SOLN: 4.0000 mg | Freq: Four times a day (QID) | INTRAMUSCULAR | Status: DC | PRN

## 2019-07-21 MED ORDER — ACETAMINOPHEN 500 MG PO TABS: ORAL_TABLET | ORAL | 0 refills | Status: DC

## 2019-07-21 MED ORDER — 0.9 % SODIUM CHLORIDE (POUR BTL) OPTIME
TOPICAL | Status: DC | PRN
  Administered 2019-07-20: 1000 mL

## 2019-07-21 MED ORDER — IBUPROFEN 200 MG PO TABS: ORAL_TABLET | ORAL | Status: DC

## 2019-07-21 MED ORDER — SERTRALINE HCL 50 MG PO TABS
50.0000 mg | ORAL_TABLET | Freq: Every day | ORAL | Status: DC
  Administered 2019-07-21: 50 mg via ORAL
  Filled 2019-07-21: qty 1

## 2019-07-21 MED ORDER — SUGAMMADEX SODIUM 200 MG/2ML IV SOLN
INTRAVENOUS | Status: DC | PRN
  Administered 2019-07-21: 200 mg via INTRAVENOUS

## 2019-07-21 MED ORDER — BUPIVACAINE-EPINEPHRINE 0.25% -1:200000 IJ SOLN
INTRAMUSCULAR | Status: DC | PRN
  Administered 2019-07-20: 30 mL

## 2019-07-21 MED ORDER — FENTANYL CITRATE (PF) 100 MCG/2ML IJ SOLN: 25.0000 ug | INTRAMUSCULAR | Status: DC | PRN

## 2019-07-21 MED ORDER — CLONAZEPAM 0.5 MG PO TABS: 0.5000 mg | ORAL_TABLET | Freq: Three times a day (TID) | ORAL | Status: DC | PRN

## 2019-07-21 MED ORDER — ARTIFICIAL TEARS OPHTHALMIC OINT
TOPICAL_OINTMENT | OPHTHALMIC | Status: AC
  Filled 2019-07-21: qty 3.5

## 2019-07-21 MED ORDER — DEXAMETHASONE SODIUM PHOSPHATE 10 MG/ML IJ SOLN
INTRAMUSCULAR | Status: DC | PRN
  Administered 2019-07-21: 10 mg via INTRAVENOUS

## 2019-07-21 NOTE — Op Note (Signed)
Preoperative diagnosis: Acute appendicitis Postoperative diagnosis: Acute suppurative appendicitis Procedure: Laparoscopic appendectomy Surgeon: Dr. Serita Grammes Anesthesia: General Estimated blood loss: Minimal Complications: None Drains: None Specimens: Appendix to pathology Sponge and needle count was correct at completion Disposition to recovery in stable condition  Indications: This is a 43 year old female who presents with about 24 hours of abdominal pain.  She has an elevated white blood cell count and right lower quadrant tenderness on exam.  She has a CT scan showing acute appendicitis.  I counseled her for laparoscopic appendectomy.  Procedure: After informed consent was obtained the patient was taken to the operating room.  She was given antibiotics.  SCDs were placed.  She had voided prior to surgery.  She was then placed under general anesthesia without complication.  She was prepped and draped in the standard sterile surgical fashion.  A surgical timeout was then performed.  I infiltrated Marcaine below her umbilicus and made a vertical incision.  I grasped her fascia and entered this sharply.  Her peritoneum was entered bluntly.  I placed a 0 Vicryl pursestring suture to the fascia.  I then inserted a Hassan trocar and insufflated the abdomen to 15 mmHg pressure.  I placed 2 additional 5 mm trocars in the lower abdomen.  I was then able to identify the appendix.  She had acute suppurative appendicitis.  There was no perforation.  This was adherent to the terminal ileum.  It was also inflamed all the way down to the base.  I used a harmonic scalpel to release the white line to rotate it medially.  I then used a harmonic scalpel to divide the appendiceal mesentery.  I then was able to encircled the base with a clamp.  I continued to divide the mesentery with harmonic scalpel until I had this skeletonized.  I then divided the appendix at the base of the cecum right near the terminal  ileum entrance into the cecum.  I then placed to the appendix in a retrieval bag and passed it off the table.  Hemostasis was observed.  There was no injury to the colon of the small bowel noted.  I then removed my Hassan trocar and tied my pursestring down.  I placed an additional 0 Vicryl suture through the umbilical defect to completely obliterate this.  The abdomen was then desufflated and the remaining trocars removed.  I then closed these with 4-0 Monocryl and glue.  She tolerated this well was extubated and transferred to recovery stable.

## 2019-07-21 NOTE — Discharge Summary (Addendum)
Physician Discharge Summary  Patient ID: Maria Duran MRN: [id number] DOB/AGE: 43/11/1975 43 y.o.  Admit date: 07/20/2019 Discharge date: 07/21/2019  Admission Diagnoses:  Acute appendicitis Hx GERD Hx polycystic ovarian disease  Discharge Diagnoses:  Acute supprative appendicitis Hx GERD Hx polycystic ovarian disease. Active Problems:   S/P laparoscopic appendectomy   PROCEDURES: Laparoscopic appendectomy 07/21/2019, Dr. Charm RingsMatthew Wakefield  Hospital Course:  [age] yof who presents with diarrhea for several days, has prior endoscopies with polyps due to fh and what she says was uc treated with doxy years ago.  She developed rlq pain this am that has worsened. It is sharp and constant. Nothing making better. Movement makes worse. No fevers. No issues voiding.  She has no psh.  She underwent evaluation in er showing elevated wbc and ct with appendicitis.   She was seen in the emergency department by Dr. Dwain SarnaWakefield and taken to the operating room.  She underwent laparoscopic appendectomy and tolerated procedure well.  She returned to the floor and was tolerating a soft diet in the a.m.  Her port sites all look good she was ambulating tolerating pain medicines well.  She was ready for discharge the a.m. after surgery.  Condition on discharge: Improved. CBC Latest Ref Rng & Units 07/20/2019 10/22/2016 07/15/2012  WBC 4.0 - 10.5 K/uL 17.0(H) 7.6 6.6  Hemoglobin 12.0 - 15.0 g/dL 40.912.8 81.112.2 91.413.4  Hematocrit 36.0 - 46.0 % 40.0 36.7 39.8  Platelets 150 - 400 K/uL 323 390.0 282.0   CMP Latest Ref Rng & Units 07/20/2019 10/22/2016 07/15/2012  Glucose 70 - 99 mg/dL 782(N134(H) 92 85  BUN 6 - 20 mg/dL 16 13 14   Creatinine 0.44 - 1.00 mg/dL 5.620.61 1.300.64 0.5  Sodium 865135 - 145 mmol/L 137 138 140  Potassium 3.5 - 5.1 mmol/L 3.5 3.9 4.3  Chloride 98 - 111 mmol/L 104 101 105  CO2 22 - 32 mmol/L 23 28 27   Calcium 8.9 - 10.3 mg/dL 9.5 9.4 9.4  Total Protein 6.5 - 8.1 g/dL 7.0 6.8 6.8  Total Bilirubin 0.3 - 1.2  mg/dL 0.6 0.4 0.5  Alkaline Phos 38 - 126 U/L 56 64 46  AST 15 - 41 U/L 15 13 18   ALT 0 - 44 U/L 11 7 17    CT scan 07/20/2019: 1. Findings consistent with acute uncomplicated appendicitis with small fecalith at the neck of the appendix. 2. Nonobstructing left nephrolithiasis. 3. Gallbladder adenomyomatosis. 4. Small amount of fluid within the cervical canal, correlate with patient's menstrual status.   Disposition: Discharge disposition: 01-Home or Self Care        Allergies as of 07/21/2019      Reactions   Erythromycin Ethylsuccinate    REACTION: rash      Medication List    TAKE these medications   acetaminophen 500 MG tablet Commonly known as: TYLENOL You can take 1000 mg every 8 hours as needed.  You can alternate this with ibuprofen.  You can buy Tylenol over-the-counter at any drugstore.  Do not take more than 4000 mg of Tylenol per day.   clonazePAM 0.5 MG tablet Commonly known as: KLONOPIN take 1 tablet by mouth every 8 hours if needed What changed:   how much to take  how to take this  when to take this  reasons to take this  additional instructions   furosemide 20 MG tablet Commonly known as: LASIX Take 20 mg by mouth daily as needed for fluid.   ibuprofen 200 MG tablet Commonly known as:  ADVIL You can take 2 to 3 tablets every 6 hours as needed for pain not relieved by plain Tylenol.  You can alternate this with the plain Tylenol/acetaminophen.  You can buy this over-the-counter at any drugstore.   IRON COMPLEX PO Take 1 tablet by mouth at bedtime.   MAGNESIUM PO Take 500 mg by mouth at bedtime.   omeprazole 40 MG capsule Commonly known as: PRILOSEC Take 1 capsule (40 mg total) by mouth daily. What changed: when to take this   oxyCODONE 5 MG immediate release tablet Commonly known as: Oxy IR/ROXICODONE Take 1 tablet (5 mg total) by mouth every 4 (four) hours as needed for severe pain or breakthrough pain (Pain not relieved by Tylenol  and ibuprofen).   sertraline 50 MG tablet Commonly known as: ZOLOFT Take 1 tablet (50 mg total) by mouth daily.   Vitamin B-12 2500 MCG Subl Take 1 tablet by mouth daily.   vitamin C 100 MG tablet Take 100 mg by mouth at bedtime.   Vitamin D3 125 MCG (5000 UT) Caps Take 1 capsule by mouth at bedtime.      Follow-up Information    Marton Redwood, MD Follow up.   Specialty: Internal Medicine Why: Call and let him know you had surgery.  Follow-up for medical issues.  Continue taking your medicines as directed by Dr. Brigitte Pulse. Contact information: [place] [address] [phone]        Rolm Bookbinder, MD Follow up on 08/14/2019.   Specialty: General Surgery Why: Your appointment is at 11:00AM.  Be at the office 30 minutes early for check in.  Bring photo ID and insurance information.   Contact information: [address] [place] [address] [phone]           Signed: Earnstine Regal 07/21/2019, 10:19 AM   Agree with above.  Alphonsa Overall, MD, [facility] Pager: [phone] Office phone:  [phone]

## 2019-07-21 NOTE — Anesthesia Postprocedure Evaluation (Signed)
Anesthesia Post Note  Patient: Maria Duran  Procedure(s) Performed: APPENDECTOMY LAPAROSCOPIC (N/A Abdomen)     Patient location during evaluation: PACU Anesthesia Type: General Level of consciousness: awake Pain management: pain level controlled Vital Signs Assessment: post-procedure vital signs reviewed and stable Respiratory status: spontaneous breathing Cardiovascular status: stable Postop Assessment: no apparent nausea or vomiting Anesthetic complications: no    Last Vitals:  Vitals:   07/21/19 0200 07/21/19 0210  BP:  105/64  Pulse:  66  Resp:  17  Temp:  (!) 36.4 C  SpO2: 94% 94%    Last Pain:  Vitals:   07/21/19 0200  PainSc: 0-No pain                 Morty Ortwein

## 2019-07-21 NOTE — Progress Notes (Addendum)
1 Day Post-Op    CC: Abdominal pain  Subjective: Patient is doing well this a.m. she is tolerated her diet.  We will get her up and ambulate her.  Plan to discharge her later this a.m.  Objective: Vital signs in last 24 hours: Temp:  [97.2 F (36.2 C)-98.8 F (37.1 C)] 98.8 F (37.1 C) (09/15 0237) Pulse Rate:  [64-93] 66 (09/15 0446) Resp:  [12-21] 17 (09/15 0446) BP: (100-128)/(56-79) 100/60 (09/15 0446) SpO2:  [90 %-100 %] 90 % (09/15 0446) Weight:  [83.5 kg] 83.5 kg (09/15 0332) Last BM Date: 07/20/19 200 p.o. 1200 IV Afebrile vital signs are stable No labs Intake/Output from previous day: 09/14 0701 - 09/15 0700 In: 1400 [P.O.:200; I.V.:1000] Out: 25 [Blood:25] Intake/Output this shift: No intake/output data recorded.  General appearance: alert, cooperative and no distress Resp: clear to auscultation bilaterally GI: Soft, sore, port sites all look fine.  Lab Results:  Recent Labs    07/20/19 1452  WBC 17.0*  HGB 12.8  HCT 40.0  PLT 323    BMET Recent Labs    07/20/19 1452  NA 137  K 3.5  CL 104  CO2 23  GLUCOSE 134*  BUN 16  CREATININE 0.61  CALCIUM 9.5   PT/INR No results for input(s): LABPROT, INR in the last 72 hours.  Recent Labs  Lab 07/20/19 1452  AST 15  ALT 11  ALKPHOS 56  BILITOT 0.6  PROT 7.0  ALBUMIN 4.1     Lipase     Component Value Date/Time   LIPASE 20 07/20/2019 1452     Prior to Admission medications   Medication Sig Start Date End Date Taking? Authorizing Provider  Ascorbic Acid (VITAMIN C) 100 MG tablet Take 100 mg by mouth at bedtime.   Yes [provider]  Cholecalciferol (VITAMIN D3) 5000 units CAPS Take 1 capsule by mouth at bedtime.    Yes [provider]  clonazePAM (KLONOPIN) 0.5 MG tablet take 1 tablet by mouth every 8 hours if needed Patient taking differently: Take 0.5 mg by mouth every 8 (eight) hours as needed for anxiety.  12/30/17  Yes Nelwyn SalisburyFry, Stephen A, MD  Cyanocobalamin (VITAMIN  B-12) 2500 MCG SUBL Take 1 tablet by mouth daily.    Yes [provider]  furosemide (LASIX) 20 MG tablet Take 20 mg by mouth daily as needed for fluid.    Yes [provider]  Iron Combinations (IRON COMPLEX PO) Take 1 tablet by mouth at bedtime.    Yes [provider]  MAGNESIUM PO Take 500 mg by mouth at bedtime.    Yes [provider]  omeprazole (PRILOSEC) 40 MG capsule Take 1 capsule (40 mg total) by mouth daily. Patient taking differently: Take 40 mg by mouth every other day.  12/30/17  Yes Nelwyn SalisburyFry, Stephen A, MD  sertraline (ZOLOFT) 50 MG tablet Take 1 tablet (50 mg total) by mouth daily. 12/27/17  Yes Nelwyn SalisburyFry, Stephen A, MD    Medications: . acetaminophen      . acetaminophen  1,000 mg Oral Q6H  . enoxaparin (LOVENOX) injection  40 mg Subcutaneous Q24H  . ketorolac      . pantoprazole  40 mg Oral Daily  . sertraline  50 mg Oral Daily   . sodium chloride 50 mL/hr at 07/21/19 0555   Assessment/Plan GERD Polycystic ovarian disease   Acute separative appendicitis Laparoscopic appendectomy 07/21/2019, Dr. Emelia LoronMatthew Wakefield  FEN: Regular diet/IV fluids ID: Rocephin/Flagyl preop DVT: Lovenox [facility]-up: [facility]  Plan: Discharge home this a.m.  LOS: 0 days    JENNINGS,WILLARD 07/21/2019 [phone]  Agree with above.  Alphonsa Overall, MD, [facility] Surgery Pager: [phone] Office phone:  [phone]

## 2019-07-21 NOTE — Discharge Instructions (Signed)
[facility] ______[facility] °LAPAROSCOPIC SURGERY: POST OP INSTRUCTIONS °Always review your discharge instruction sheet given to you by the facility where your surgery was performed. °IF YOU HAVE DISABILITY OR FAMILY LEAVE FORMS, YOU MUST BRING THEM TO THE OFFICE FOR PROCESSING.   °DO NOT GIVE THEM TO YOUR DOCTOR. ° °1. A prescription for pain medication may be given to you upon discharge.  Take your pain medication as prescribed, if needed.  If narcotic pain medicine is not needed, then you may take acetaminophen (Tylenol) or ibuprofen (Advil) as needed. °2. Take your usually prescribed medications unless otherwise directed. °3. If you need a refill on your pain medication, please contact your pharmacy.  They will contact our office to request authorization. Prescriptions will not be filled after 5pm or on week-ends. °4. You should follow a light diet the first few days after arrival home, such as soup and crackers, etc.  Be sure to include lots of fluids daily. °5. Most patients will experience some swelling and bruising in the area of the incisions.  Ice packs will help.  Swelling and bruising can take several days to resolve.  °6. It is common to experience some constipation if taking pain medication after surgery.  Increasing fluid intake and taking a stool softener (such as Colace) will usually help or prevent this problem from occurring.  A mild laxative (Milk of Magnesia or Miralax) should be taken according to package instructions if there are no bowel movements after 48 hours. °7. Unless discharge instructions indicate otherwise, you may remove your bandages 24-48 hours after surgery, and you may shower at that time.  You may have steri-strips (small skin tapes) in place directly over the incision.  These strips should be left on the skin for 7-10 days.  If your surgeon used skin glue on the incision, you may shower in 24 hours.  The glue will flake off over the next 2-3 weeks.  Any sutures or  staples will be removed at the office during your follow-up visit. °8. ACTIVITIES:  You may resume regular (light) daily activities beginning the next day--such as daily self-care, walking, climbing stairs--gradually increasing activities as tolerated.  You may have sexual intercourse when it is comfortable.  Refrain from any heavy lifting or straining until approved by your doctor. °a. You may drive when you are no longer taking prescription pain medication, you can comfortably wear a seatbelt, and you can safely maneuver your car and apply brakes. °b. RETURN TO WORK:  __________________________________________________________ °9. You should see your doctor in the office for a follow-up appointment approximately 2-3 weeks after your surgery.  Make sure that you call for this appointment within a day or two after you arrive home to insure a convenient appointment time. °10. OTHER INSTRUCTIONS: __________________________________________________________________________________________________________________________ __________________________________________________________________________________________________________________________ °WHEN TO CALL YOUR DOCTOR: °1. Fever over 101.0 °2. Inability to urinate °3. Continued bleeding from incision. °4. Increased pain, redness, or drainage from the incision. °5. Increasing abdominal pain ° °The clinic staff is available to answer your questions during regular business hours.  Please don’t hesitate to call and ask to speak to one of the nurses for clinical concerns.  If you have a medical emergency, go to the nearest emergency room or call 911.  A surgeon from [facility] is always on call at the hospital. °[address], [address], [place], [place]  [address] ? [address] [address], [place],    [address] °[phone] ? [phone] ? FAX [phone] °Web site:   [email or web address]   Laparoscopic Appendectomy, Adult, Care After This sheet  gives you information about how to care for yourself after your procedure. Your health care provider may also give you more specific instructions. If you have problems or questions, contact your health care provider. What can I expect after the procedure? After the procedure, it is common to have:  Little energy for normal activities.  Mild pain in the area where the incisions were made.  Difficulty passing stool (constipation). This can be caused by: ? Pain medicine. ? A decrease in your activity. Follow these instructions at home: Medicines  Take over-the-counter and prescription medicines only as told by your health care provider.  If you were prescribed an antibiotic medicine, take it as told by your health care provider. Do not stop taking the antibiotic even if you start to feel better.  Do not drive or use heavy machinery while taking prescription pain medicine.  Ask your health care provider if the medicine prescribed to you can cause constipation. You may need to take steps to prevent or treat constipation, such as: ? Drink enough fluid to keep your urine pale yellow. ? Take over-the-counter or prescription medicines. ? Eat foods that are high in fiber, such as beans, whole grains, and fresh fruits and vegetables. ? Limit foods that are high in fat and processed sugars, such as fried or sweet foods. Incision care   Follow instructions from your health care provider about how to take care of your incisions. Make sure you: ? Wash your hands with soap and water before and after you change your bandage (dressing). If soap and water are not available, use hand sanitizer. ? Change your dressing as told by your health care provider. ? Leave stitches (sutures), skin glue, or adhesive strips in place. These skin closures may need to stay in place for 2 weeks or longer. If adhesive strip edges start to loosen and curl up, you may trim the loose edges. Do not remove adhesive strips  completely unless your health care provider tells you to do that.  Check your incision areas every day for signs of infection. Check for: ? Redness, swelling, or pain. ? Fluid or blood. ? Warmth. ? Pus or a bad smell. Bathing  Keep your incisions clean and dry. Clean them as often as told by your health care provider. To do this: 1. Gently wash the incisions with soap and water. 2. Rinse the incisions with water to remove all soap. 3. Pat the incisions dry with a clean towel. Do not rub the incisions.  Do not take baths, swim, or use a hot tub for 2 weeks, or until your health care provider approves. You may take showers after 48 hours. Activity   Do not drive for 24 hours if you were given a sedative during your procedure.  Rest after the procedure. Return to your normal activities as told by your health care provider. Ask your health care provider what activities are safe for you.  For 3 weeks, or for as long as told by your health care provider: ? Do not lift anything that is heavier than 10 lb (4.5 kg), or the limit that you are told. ? Do not play contact sports. General instructions  If you were sent home with a drain, follow instructions from your health care provider about how to care for it.  Take deep breaths. This helps to prevent your lungs from developing an infection (pneumonia).  Keep all follow-up visits as  told by your health care provider. This is important. Contact a health care provider if:  You have redness, swelling, or pain around an incision.  You have fluid or blood coming from an incision.  Your incision feels warm to the touch.  You have pus or a bad smell coming from an incision or dressing.  Your incision edges break open after your sutures have been removed.  You have increasing pain in your shoulders.  You feel dizzy or you faint.  You develop shortness of breath.  You keep feeling nauseous or you are vomiting.  You have diarrhea or  you cannot control your bowel functions.  You lose your appetite.  You develop swelling or pain in your legs.  You develop a rash. Get help right away if you have:  A fever.  Difficulty breathing.  Sharp pains in your chest. Summary  After a laparoscopic appendectomy, it is common to have little energy for normal activities, mild pain in the area of the incisions, and constipation.  Infection is the most common complication after this procedure. Follow your health care provider's instructions about caring for yourself after the procedure.  Rest after the procedure. Return to your normal activities as told by your health care provider.  Contact your health care provider if you notice signs of infection around your incisions or you develop shortness of breath. Get help right away if you have a fever, chest pain, or difficulty breathing. This information is not intended to replace advice given to you by your health care provider. Make sure you discuss any questions you have with your health care provider. Document Released: 10/22/2005 Document Revised: 04/24/2018 Document Reviewed: 04/24/2018 Elsevier Patient Education  2020 [employer].

## 2019-07-21 NOTE — Transfer of Care (Signed)
Immediate Anesthesia Transfer of Care Note  Patient: Maria Duran  Procedure(s) Performed: APPENDECTOMY LAPAROSCOPIC (N/A Abdomen)  Patient Location: PACU  Anesthesia Type:General  Level of Consciousness: awake  Airway & Oxygen Therapy: Patient Spontanous Breathing  Post-op Assessment: Report given to RN and Post -op Vital signs reviewed and stable  Post vital signs: Reviewed and stable  Last Vitals:  Vitals Value Taken Time  BP    Temp    Pulse    Resp 15 07/21/19 0056  SpO2    Vitals shown include unvalidated device data.  Last Pain:  Vitals:   07/20/19 2237  PainSc: 8          Complications: No apparent anesthesia complications

## 2019-07-22 LAB — SURGICAL PATHOLOGY

## 2019-10-28 DIAGNOSIS — Z20828 Contact with and (suspected) exposure to other viral communicable diseases: Secondary | ICD-10-CM | POA: Diagnosis not present

## 2019-11-19 DIAGNOSIS — R05 Cough: Secondary | ICD-10-CM | POA: Diagnosis not present

## 2019-11-19 DIAGNOSIS — R110 Nausea: Secondary | ICD-10-CM | POA: Diagnosis not present

## 2019-11-20 ENCOUNTER — Other Ambulatory Visit: Payer: BC Managed Care – PPO

## 2019-12-14 ENCOUNTER — Ambulatory Visit: Payer: BC Managed Care – PPO | Attending: Internal Medicine

## 2019-12-14 DIAGNOSIS — Z20822 Contact with and (suspected) exposure to covid-19: Secondary | ICD-10-CM | POA: Diagnosis not present

## 2019-12-15 LAB — NOVEL CORONAVIRUS, NAA: SARS-CoV-2, NAA: NOT DETECTED

## 2020-02-18 DIAGNOSIS — Z6829 Body mass index (BMI) 29.0-29.9, adult: Secondary | ICD-10-CM | POA: Diagnosis not present

## 2020-02-18 DIAGNOSIS — E669 Obesity, unspecified: Secondary | ICD-10-CM | POA: Diagnosis not present

## 2020-02-18 DIAGNOSIS — Z113 Encounter for screening for infections with a predominantly sexual mode of transmission: Secondary | ICD-10-CM | POA: Diagnosis not present

## 2020-02-18 DIAGNOSIS — Z01419 Encounter for gynecological examination (general) (routine) without abnormal findings: Secondary | ICD-10-CM | POA: Diagnosis not present

## 2020-02-18 DIAGNOSIS — E7849 Other hyperlipidemia: Secondary | ICD-10-CM | POA: Diagnosis not present

## 2020-02-18 DIAGNOSIS — Z0000 Encounter for general adult medical examination without abnormal findings: Secondary | ICD-10-CM | POA: Diagnosis not present

## 2020-02-18 DIAGNOSIS — Z1231 Encounter for screening mammogram for malignant neoplasm of breast: Secondary | ICD-10-CM | POA: Diagnosis not present

## 2020-02-18 DIAGNOSIS — D509 Iron deficiency anemia, unspecified: Secondary | ICD-10-CM | POA: Diagnosis not present

## 2020-02-25 DIAGNOSIS — Z0000 Encounter for general adult medical examination without abnormal findings: Secondary | ICD-10-CM | POA: Diagnosis not present

## 2020-02-25 DIAGNOSIS — E785 Hyperlipidemia, unspecified: Secondary | ICD-10-CM | POA: Diagnosis not present

## 2020-02-25 DIAGNOSIS — R42 Dizziness and giddiness: Secondary | ICD-10-CM | POA: Diagnosis not present

## 2020-02-25 DIAGNOSIS — D509 Iron deficiency anemia, unspecified: Secondary | ICD-10-CM | POA: Diagnosis not present

## 2020-02-25 DIAGNOSIS — Z1212 Encounter for screening for malignant neoplasm of rectum: Secondary | ICD-10-CM | POA: Diagnosis not present

## 2020-02-25 DIAGNOSIS — N920 Excessive and frequent menstruation with regular cycle: Secondary | ICD-10-CM | POA: Diagnosis not present

## 2020-04-21 DIAGNOSIS — Z113 Encounter for screening for infections with a predominantly sexual mode of transmission: Secondary | ICD-10-CM | POA: Diagnosis not present

## 2020-04-21 DIAGNOSIS — N760 Acute vaginitis: Secondary | ICD-10-CM | POA: Diagnosis not present

## 2020-04-21 DIAGNOSIS — R350 Frequency of micturition: Secondary | ICD-10-CM | POA: Diagnosis not present

## 2020-04-21 DIAGNOSIS — R399 Unspecified symptoms and signs involving the genitourinary system: Secondary | ICD-10-CM | POA: Diagnosis not present

## 2021-06-10 ENCOUNTER — Emergency Department (HOSPITAL_COMMUNITY): Admission: EM | Admit: 2021-06-10 | Discharge: 2021-06-11 | Discharge status: Against Medical Advice | Payer: 59

## 2021-06-10 NOTE — ED Notes (Signed)
This patient decided to leave AMA and gave her patient labels to a [employer]

## 2021-06-13 ENCOUNTER — Other Ambulatory Visit: Payer: Self-pay

## 2021-06-13 ENCOUNTER — Encounter: Payer: Self-pay | Admitting: Allergy & Immunology

## 2021-06-13 ENCOUNTER — Ambulatory Visit: Payer: 59 | Admitting: Allergy & Immunology

## 2021-06-13 VITALS — BP 126/72 | HR 74 | Temp 97.8°F | Ht 65.0 in | Wt 184.4 lb

## 2021-06-13 DIAGNOSIS — J3089 Other allergic rhinitis: Secondary | ICD-10-CM

## 2021-06-13 DIAGNOSIS — R42 Dizziness and giddiness: Secondary | ICD-10-CM | POA: Diagnosis not present

## 2021-06-13 DIAGNOSIS — J302 Other seasonal allergic rhinitis: Secondary | ICD-10-CM | POA: Diagnosis not present

## 2021-06-13 MED ORDER — TRIAMCINOLONE ACETONIDE 55 MCG/ACT NA AERO: 1.0000 | INHALATION_SPRAY | Freq: Every day | NASAL | 5 refills | Status: AC

## 2021-06-13 MED ORDER — MECLIZINE HCL 25 MG PO TABS: ORAL_TABLET | ORAL | 3 refills | Status: DC

## 2021-06-13 NOTE — Addendum Note (Signed)
Addended by: Robet Leu A on: 06/13/2021 07:13 PM   Modules accepted: Orders

## 2021-06-13 NOTE — Progress Notes (Signed)
NEW PATIENT  Date of Service/Encounter:  06/13/21  Consult requested by: Cleatis Polka., MD   Assessment:   Seasonal and perennial allergic rhinitis (grasses, trees, dust mites, cat, and cockroach)  Dizziness  Plan/Recommendations:    1. Seasonal and perennial allergic rhinitis - Testing today showed: grasses, trees, dust mites, cat, and cockroach - Copy of test results provided.  - Avoidance measures provided. - Start taking: Nasacort (triamcinolone) one spray per nostril daily and meclizine 25mg  2-3 times daily - Meclizine can cause sleepiness, so aware of that and try starting it on a weekend. - Meclizine is also indicated for treatment of dizziness and might help.  - Consider nasal saline rinses 1-2 times daily to remove allergens from the nasal cavities as well as help with mucous clearance (this is especially helpful to do before the nasal sprays are given) - Consider allergy shots as a means of long-term control. - Allergy shots "re-train" and "reset" the immune system to ignore environmental allergens and decrease the resulting immune response to those allergens (sneezing, itchy watery eyes, runny nose, nasal congestion, etc).    - Allergy shots improve symptoms in 75-85% of patients.  - We can discuss more at the next appointment if the medications are not working for you.  2. Return in about 2 months (around 08/13/2021).    This note in its entirety was forwarded to the Provider who requested this consultation.  Subjective:   Maria Duran is a 45 y.o. 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  07/16/2007 was referred by Caleb Popp., MD.     Maria Duran is a 45 y.o. female presenting for an evaluation of dizziness . There is concern that this is allergy related. She reports that she feels that she is "bouncing on a trampoline". It usually starts in March and April and stops in September. The first time that it happened was when she got off of a boat in [place]. Maria Dur[place]. This was around 12 years ago. 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 She has clonidine to use as needed. This does not cause sleepiness but takes the edge off. She has tried Flonase to start in the beginning of March but it still came. She did not use it every day but it still came. It stopped early this year. She has tried meclizine. She has not tried any cetirizine or Benadryl. She has not fallen over or had injuries from the dizziness. She did see ENT years ago.   She was [place] and then moved to [place] around age 72. She does real estate.   She does report some itching that is chronic. She has sensitive skin. Sometimes her eyes bother her. She turns red at the slightest touch. She has had sensitive skin  for decades. She gets occasional sinus infections. She had one earlier this year.   She did have asthma as a child. She does not have an inhaler and does not really cough at night. She has been on prednisone in the past but she is unsure of what it was for.   Otherwise, there is no history of other atopic diseases, including drug allergies, stinging insect allergies, eczema, urticaria, or contact dermatitis. There is no significant infectious history. Vaccinations are up to date.    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 If you have any questions, ask your nurse or doctor.          acetaminophen 500 MG tablet Commonly known as: TYLENOL You can take 1000 mg every 8 hours as needed.  You can alternate this with ibuprofen.  You can buy Tylenol over-the-counter at any drugstore.  Do not take more than 4000 mg of Tylenol per day.   clonazePAM 0.5 MG tablet Commonly known as: KLONOPIN take 1 tablet by mouth every 8 hours if needed What changed:  how much to take how to take this when to take this reasons to take this additional instructions   fluticasone 50 MCG/ACT nasal spray Commonly known as: FLONASE Place 1 spray into both nostrils in the morning and at bedtime.   furosemide 20 MG tablet Commonly known as: LASIX Take 20 mg by mouth daily as needed for fluid.   IRON COMPLEX PO Take 1 tablet by mouth at bedtime.   MAGNESIUM PO Take 500 mg by mouth at bedtime.   omeprazole 40 MG capsule Commonly known as: PRILOSEC Take 1 capsule (40 mg total) by mouth daily. What changed: when to take this   sertraline 50 MG tablet Commonly known as: ZOLOFT Take 1 tablet (50 mg total) by mouth daily.   Vitamin B-12 2500 MCG Subl Take 1 tablet by mouth daily.   vitamin C 100 MG tablet Take 100 mg by mouth at bedtime.   Vitamin D3 125 MCG (5000 UT) Caps Take 1 capsule by mouth at  bedtime.        Birth History: non-contributory  Developmental History: non-contributory  Past Surgical History: Past Surgical History:  Procedure Laterality Date   colonoscopy     ESOPHAGOGASTRODUODENOSCOPY     LAPAROSCOPIC APPENDECTOMY N/A 07/20/2019   Procedure: APPENDECTOMY LAPAROSCOPIC;  Surgeon: Emelia Loron, MD;  Location[facility];  Service: General;  Laterality: N/A;     Family History: Family History  Problem Relation Age of Onset   Hypertension Father    Hyperlipidemia Father    Alzheimer's disease Father    Migraines Brother    Crohn's disease Maternal Grandfather    Colon cancer Paternal Grandfather    Colon cancer Other        1st Degree Relative <60   Hyperlipidemia  Other    Hypertension Other    Crohn's disease Other      Social History: Maria Duran lives at home in a house that was built in 1950. There are hardwoods and rugs throughout the building. There is gas heating and central cooling. There are no animals inside or outside of the home. There are no dust mite coverings on the bedding. She works as a Customer service manager. There is no HEPA filter in the home.    Review of Systems  Constitutional: Negative.  Negative for chills, fever, malaise/fatigue and weight loss.  HENT:  Positive for congestion. Negative for ear discharge, ear pain and sinus pain.   Eyes:  Negative for pain, discharge and redness.  Respiratory:  Negative for cough, sputum production, shortness of breath and wheezing.   Cardiovascular: Negative.  Negative for chest pain and palpitations.  Gastrointestinal:  Negative for abdominal pain, constipation, diarrhea, heartburn, nausea and vomiting.  Skin: Negative.  Negative for itching and rash.  Neurological:  Positive for dizziness. Negative for headaches.  Endo/Heme/Allergies:  Positive for environmental allergies. Does not bruise/bleed easily.      Objective:   Blood pressure 126/72, pulse 74, temperature 97.8 F (36.6 C), height  5\' 5"  (1.651 m), weight 184 lb 6.4 oz (83.6 kg), SpO2 96 %. Body mass index is 30.69 kg/m.   Physical Exam:   Physical Exam Vitals reviewed.  Constitutional:      Appearance: She is well-developed.     Comments: Pleasant female.  HENT:     Head: Normocephalic and atraumatic.     Right Ear: Tympanic membrane, ear canal and external ear normal. No drainage, swelling or tenderness. Tympanic membrane is not injected, scarred, erythematous, retracted or bulging.     Left Ear: Tympanic membrane, ear canal and external ear normal. No drainage, swelling or tenderness. Tympanic membrane is not injected, scarred, erythematous, retracted or bulging.     Nose: No nasal deformity, septal deviation, mucosal edema or rhinorrhea.     Right Turbinates: Enlarged and swollen.     Left Turbinates: Enlarged and swollen.     Right Sinus: No maxillary sinus tenderness or frontal sinus tenderness.     Left Sinus: No maxillary sinus tenderness or frontal sinus tenderness.     Mouth/Throat:     Mouth: Mucous membranes are not pale and not dry.     Pharynx: Uvula midline.  Eyes:     General:        Right eye: No discharge.        Left eye: No discharge.     Conjunctiva/sclera: Conjunctivae normal.     Right eye: Right conjunctiva is not injected. No chemosis.    Left eye: Left conjunctiva is not injected. No chemosis.    Pupils: Pupils are equal, round, and reactive to light.  Cardiovascular:     Rate and Rhythm: Normal rate and regular rhythm.     Heart sounds: Normal heart sounds.  Pulmonary:     Effort: Pulmonary effort is normal. No tachypnea, accessory muscle usage or respiratory distress.     Breath sounds: Normal breath sounds. No wheezing, rhonchi or rales.     Comments: Moving air well in all lung fields. Chest:     Chest wall: No tenderness.  Abdominal:     Tenderness: There is no abdominal tenderness. There is no guarding or rebound.  Lymphadenopathy:     Head:     Right side of head:  No submandibular, tonsillar or occipital adenopathy.  Left side of head: No submandibular, tonsillar or occipital adenopathy.     Cervical: No cervical adenopathy.  Skin:    General: Skin is warm.     Capillary Refill: Capillary refill takes less than 2 seconds.     Coloration: Skin is not pale.     Findings: No abrasion, erythema, petechiae or rash. Rash is not papular, urticarial or vesicular.     Comments: Multiple nevi.  Neurological:     Mental Status: She is alert.  Psychiatric:        Behavior: Behavior is cooperative.     Diagnostic studies:    Allergy Studies:     Airborne Adult Perc - 06/13/21 1011     Time Antigen Placed 1011    Allergen Manufacturer Waynette Buttery    Location Back    Number of Test 59    1. Control-Buffer 50% Glycerol Negative    2. Control-Histamine 1 mg/ml 2+    3. Albumin saline Negative    4. Bahia Negative    5. French Southern Territories Negative    6. Johnson 2+    7. Kentucky Blue Negative    8. Meadow Fescue Negative    9. Perennial Rye Negative    10. Sweet Vernal Negative    11. Timothy Negative    12. Cocklebur Negative    13. Burweed Marshelder Negative    14. Ragweed, short Negative    15. Ragweed, Giant Negative    16. Plantain,  English Negative    17. Lamb's Quarters Negative    18. Sheep Sorrell Negative    19. Rough Pigweed Negative    20. Marsh Elder, Rough Negative    21. Mugwort, Common Negative    22. Ash mix Negative    23. Birch mix Negative    24. Beech American Negative    25. Box, Elder Negative    26. Cedar, red Negative    27. Cottonwood, Guinea-Bissau Negative    28. Elm mix Negative    29. Hickory Negative    30. Maple mix Negative    31. Oak, Guinea-[place]issau mix Negative    32. Pecan Pollen 3+    33. Pine mix Negative    34. Sycamore Eastern Negative    35. Walnut, Black Pollen 3+    36. Alternaria alternata Negative    37. Cladosporium Herbarum Negative    38. Aspergillus mix Negative    39. Penicillium mix Negative    40.  Bipolaris sorokiniana (Helminthosporium) Negative    41. Drechslera spicifera (Curvularia) Negative    42. Mucor plumbeus Negative    43. Fusarium moniliforme Negative    44. Aureobasidium pullulans (pullulara) Negative    45. Rhizopus oryzae Negative    46. Botrytis cinera Negative    47. Epicoccum nigrum Negative    48. Phoma betae Negative    49. Candida Albicans Negative    50. Trichophyton mentagrophytes Negative    51. Mite, D Farinae  5,000 AU/ml 4+    52. Mite, D Pteronyssinus  5,000 AU/ml 4+    53. Cat Hair 10,000 BAU/ml 2+    54.  Dog Epithelia Negative    55. Mixed Feathers Negative    56. Horse Epithelia Negative    57. Cockroach, German 2+    58. Mouse Negative    59. Tobacco Leaf Negative             Intradermal - 06/13/21 1056     Time Antigen Placed 1040    Allergen  Manufacturer Greer    Location Arm    Number of Test 11    Intradermal Select    Control Negative    French Southern TerritoriesBermuda Negative    7 Grass Negative    Ragweed mix Negative    Weed mix Negative    Tree mix 3+    Mold 1 Negative    Mold 2 Negative    Mold 3 Negative    Mold 4 Negative    Dog Negative             Allergy testing results were read and interpreted by myself, documented by clinical staff.         Malachi BondsJoel Manha Amato, MD [facility] [place]

## 2021-06-13 NOTE — Patient Instructions (Addendum)
1. Seasonal and perennial allergic rhinitis - Testing today showed: grasses, trees, dust mites, cat, and cockroach - Copy of test results provided.  - Avoidance measures provided. - Start taking: Nasacort (triamcinolone) one spray per nostril daily and meclizine 25mg  2-3 times daily - Meclizine can cause sleepiness, so aware of that and try starting it on a weekend. - Meclizine is also indicated for treatment of dizziness and might help.  - Consider nasal saline rinses 1-2 times daily to remove allergens from the nasal cavities as well as help with mucous clearance (this is especially helpful to do before the nasal sprays are given) - Consider allergy shots as a means of long-term control. - Allergy shots "re-train" and "reset" the immune system to ignore environmental allergens and decrease the resulting immune response to those allergens (sneezing, itchy watery eyes, runny nose, nasal congestion, etc).    - Allergy shots improve symptoms in 75-85% of patients.  - We can discuss more at the next appointment if the medications are not working for you.  2. Return in about 2 months (around 08/13/2021).    Please inform us of any Emergency Department visits, hospitalizations, or changes in symptoms. Call us before going to the ED for breathing or allergy symptoms since we might be able to fit you in for a sick visit. Feel free to contact us anytime with any questions, problems, or concerns.  It was a pleasure to meet you today!  Websites that have reliable patient information: 1. American Academy of Asthma, Allergy, and Immunology: www.aaaai.org 2. Food Allergy Research and Education (FARE): foodallergy.org 3. Mothers of Asthmatics: http://www.asthmacommunitynetwork.org 4. American College of Allergy, Asthma, and Immunology: www.acaai.org   COVID-19 Vaccine Information can be found at: [email or web address] For questions related to  vaccine distribution or appointments, please email [email or web address] or call [phone].   We realize that you might be concerned about having an allergic reaction to the COVID19 vaccines. To help with that concern, WE ARE OFFERING THE COVID19 VACCINES IN OUR OFFICE! Ask the front desk for dates!     "Like" us on Facebook and Instagram for our latest updates!      A healthy democracy works best when [employer] voters participate! Make sure you are registered to vote! If you have moved or changed any of your contact information, you will need to get this updated before voting!  In some cases, you MAY be able to register to vote online: [email or web address]      1. Control-Buffer 50% Glycerol Negative   2. Control-Histamine 1 mg/ml 2+   3. Albumin saline Negative   4. Bahia Negative   5. [place] Negative   6. Johnson 2+   7. Kentucky Blue Negative   8. Meadow Fescue Negative   9. Perennial Rye Negative   10. Sweet Vernal Negative   11. Timothy Negative   12. Cocklebur Negative   13. Burweed Marshelder Negative   14. Ragweed, short Negative   15. Ragweed, Giant Negative   16. Plantain,  English Negative   17. Lamb's Quarters Negative   18. Sheep Sorrell Negative   19. Rough Pigweed Negative   20. Marsh Elder, Rough Negative   21. Mugwort, Common Negative   22. Ash mix Negative   23. Birch mix Negative   24. Beech American Negative   25. Box, Elder Negative   26. Cedar, red Negative   27. Cottonwood, Guinea-Biss[place]Eastern Negative   28. Elm mix Negative   29. Hickory Negative   30. Maple mix Negative  31. Oak, [place] mix Negative   32. Pecan Pollen 3+   33. Pine mix Negative   34. Sycamore Eastern Negative   35. Walnut, Black Pollen 3+   36. Alternaria alternata Negative   37. Cladosporium Herbarum Negative   38. Aspergillus mix Negative   39. Penicillium mix Negative   40. Bipolaris sorokiniana (Helminthosporium) Negative   41. Drechslera  spicifera (Curvularia) Negative   42. Mucor plumbeus Negative   43. Fusarium moniliforme Negative   44. Aureobasidium pullulans (pullulara) Negative   45. Rhizopus oryzae Negative   46. Botrytis cinera Negative   47. Epicoccum nigrum Negative   48. Phoma betae Negative   49. Candida Albicans Negative   50. Trichophyton mentagrophytes Negative   51. Mite, D Farinae  5,000 AU/ml 4+   52. Mite, D Pteronyssinus  5,000 AU/ml 4+   53. Cat Hair 10,000 BAU/ml 2+   54.  Dog Epithelia Negative   55. Mixed Feathers Negative   56. Horse Epithelia Negative   57. Cockroach, German 2+   58. Mouse Negative   59. Tobacco Leaf Negative     Control Negative   [place] Negative   7 Grass Negative   Ragweed mix Negative   Weed mix Negative   Tree mix 3+   Mold 1 Negative   Mold 2 Negative   Mold 3 Negative   Mold 4 Negative   Dog Negative     Reducing Pollen Exposure  The American Academy of Allergy, Asthma and Immunology suggests the following steps to reduce your exposure to pollen during allergy seasons.    Do not hang sheets or clothing out to dry; pollen may collect on these items. Do not mow lawns or spend time around freshly cut grass; mowing stirs up pollen. Keep windows closed at night.  Keep car windows closed while driving. Minimize morning activities outdoors, a time when pollen counts are usually at their highest. Stay indoors as much as possible when pollen counts or humidity is high and on windy days when pollen tends to remain in the air longer. Use air conditioning when possible.  Many air conditioners have filters that trap the pollen spores. Use a HEPA room air filter to remove pollen form the indoor air you breathe.  Control of Dust Mite Allergen    Dust mites play a major role in allergic asthma and rhinitis.  They occur in environments with high humidity wherever human skin is found.  Dust mites absorb humidity from the atmosphere (ie, they do not drink) and feed on  organic matter (including shed human and animal skin).  Dust mites are a microscopic type of insect that you cannot see with the naked eye.  High levels of dust mites have been detected from mattresses, pillows, carpets, upholstered furniture, bed covers, clothes, soft toys and any woven material.  The principal allergen of the dust mite is found in its feces.  A gram of dust may contain 1,000 mites and 250,000 fecal particles.  Mite antigen is easily measured in the air during house cleaning activities.  Dust mites do not bite and do not cause harm to humans, other than by triggering allergies/asthma.    Ways to decrease your exposure to dust mites in your home:  Encase mattresses, box springs and pillows with a mite-impermeable barrier or cover   Wash sheets, blankets and drapes weekly in hot water (130 F) with detergent and dry them in a dryer on the hot setting.  Have the room cleaned  frequently with a vacuum cleaner and a damp dust-mop.  For carpeting or rugs, vacuuming with a vacuum cleaner equipped with a high-efficiency particulate air (HEPA) filter.  The dust mite allergic individual should not be in a room which is being cleaned and should wait 1 hour after cleaning before going into the room. Do not sleep on upholstered furniture (eg, couches).   If possible removing carpeting, upholstered furniture and drapery from the home is ideal.  Horizontal blinds should be eliminated in the rooms where the person spends the most time (bedroom, study, television room).  Washable vinyl, roller-type shades are optimal. Remove all non-washable stuffed toys from the bedroom.  Wash stuffed toys weekly like sheets and blankets above.   Reduce indoor humidity to less than 50%.  Inexpensive humidity monitors can be purchased at most hardware stores.  Do not use a humidifier as can make the problem worse and are not recommended.  Control of Dog or Cat Allergen  Avoidance is the best way to manage a dog or cat  allergy. If you have a dog or cat and are allergic to dog or cats, consider removing the dog or cat from the home. If you have a dog or cat but don't want to find it a new home, or if your family wants a pet even though someone in the household is allergic, here are some strategies that may help keep symptoms at bay:  Keep the pet out of your bedroom and restrict it to only a few rooms. Be advised that keeping the dog or cat in only one room will not limit the allergens to that room. Don't pet, hug or kiss the dog or cat; if you do, wash your hands with soap and water. High-efficiency particulate air (HEPA) cleaners run continuously in a bedroom or living room can reduce allergen levels over time. Regular use of a high-efficiency vacuum cleaner or a central vacuum can reduce allergen levels. Giving your dog or cat a bath at least once a week can reduce airborne allergen.  Control of Cockroach Allergen  Cockroach allergen has been identified as an important cause of acute attacks of asthma, especially in urban settings.  There are fifty-five species of cockroach that exist in the [place], however only three, the [place], [place] species produce allergen that can affect patients with Asthma.  Allergens can be obtained from fecal particles, egg casings and secretions from cockroaches.    Remove food sources. Reduce access to water. Seal access and entry points. Spray runways with 0.5-1% Diazinon or Chlorpyrifos Blow boric acid power under stoves and refrigerator. Place bait stations (hydramethylnon) at feeding sites.   Allergy Shots   Allergies are the result of a chain reaction that starts in the immune system. Your immune system controls how your body defends itself. For instance, if you have an allergy to pollen, your immune system identifies pollen as an invader or allergen. Your immune system overreacts by producing antibodies called Immunoglobulin E (IgE). These  antibodies travel to cells that release chemicals, causing an allergic reaction.  The concept behind allergy immunotherapy, whether it is received in the form of shots or tablets, is that the immune system can be desensitized to specific allergens that trigger allergy symptoms. Although it requires time and patience, the payback can be long-term relief.  How Do Allergy Shots Work?  Allergy shots work much like a vaccine. Your body responds to injected amounts of a particular allergen given in increasing doses, eventually  developing a resistance and tolerance to it. Allergy shots can lead to decreased, minimal or no allergy symptoms.  There generally are two phases: build-up and maintenance. Build-up often ranges from three to six months and involves receiving injections with increasing amounts of the allergens. The shots are typically given once or twice a week, though more rapid build-up schedules are sometimes used.  The maintenance phase begins when the most effective dose is reached. This dose is different for each person, depending on how allergic you are and your response to the build-up injections. Once the maintenance dose is reached, there are longer periods between injections, typically two to four weeks.  Occasionally doctors give cortisone-type shots that can temporarily reduce allergy symptoms. These types of shots are different and should not be confused with allergy immunotherapy shots.  Who Can Be Treated with Allergy Shots?  Allergy shots may be a good treatment approach for people with allergic rhinitis (hay fever), allergic asthma, conjunctivitis (eye allergy) or stinging insect allergy.   Before deciding to begin allergy shots, you should consider:   The length of allergy season and the severity of your symptoms  Whether medications and/or changes to your environment can control your symptoms  Your desire to avoid long-term medication use  Time: allergy immunotherapy  requires a major time commitment  Cost: may vary depending on your insurance coverage  Allergy shots for children age 29 and older are effective and often well tolerated. They might prevent the onset of new allergen sensitivities or the progression to asthma.  Allergy shots are not started on patients who are pregnant but can be continued on patients who become pregnant while receiving them. In some patients with other medical conditions or who take certain common medications, allergy shots may be of risk. It is important to mention other medications you talk to your allergist.   When Will I Feel Better?  Some may experience decreased allergy symptoms during the build-up phase. For others, it may take as long as 12 months on the maintenance dose. If there is no improvement after a year of maintenance, your allergist will discuss other treatment options with you.  If you aren't responding to allergy shots, it may be because there is not enough dose of the allergen in your vaccine or there are missing allergens that were not identified during your allergy testing. Other reasons could be that there are high levels of the allergen in your environment or major exposure to non-allergic triggers like tobacco smoke.  What Is the Length of Treatment?  Once the maintenance dose is reached, allergy shots are generally continued for three to five years. The decision to stop should be discussed with your allergist at that time. Some people may experience a permanent reduction of allergy symptoms. Others may relapse and a longer course of allergy shots can be considered.  What Are the Possible Reactions?  The two types of adverse reactions that can occur with allergy shots are local and systemic. Common local reactions include very mild redness and swelling at the injection site, which can happen immediately or several hours after. A systemic reaction, which is less common, affects the entire body or a  particular body system. They are usually mild and typically respond quickly to medications. Signs include increased allergy symptoms such as sneezing, a stuffy nose or hives.  Rarely, a serious systemic reaction called anaphylaxis can develop. Symptoms include swelling in the throat, wheezing, a feeling of tightness in the chest, nausea or dizziness.  Most serious systemic reactions develop within 30 minutes of allergy shots. This is why it is strongly recommended you wait in your doctor's office for 30 minutes after your injections. Your allergist is trained to watch for reactions, and his or her staff is trained and equipped with the proper medications to identify and treat them.  Who Should Administer Allergy Shots?  The preferred location for receiving shots is your prescribing allergist's office. Injections can sometimes be given at another facility where the physician and staff are trained to recognize and treat reactions, and have received instructions by your prescribing allergist.

## 2021-06-15 ENCOUNTER — Encounter: Payer: Self-pay | Admitting: Nurse Practitioner

## 2021-06-15 ENCOUNTER — Ambulatory Visit: Payer: 59 | Admitting: Nurse Practitioner

## 2021-06-15 VITALS — BP 108/66 | HR 72 | Ht 65.0 in | Wt 186.0 lb

## 2021-06-15 DIAGNOSIS — R112 Nausea with vomiting, unspecified: Secondary | ICD-10-CM | POA: Diagnosis not present

## 2021-06-15 DIAGNOSIS — R1011 Right upper quadrant pain: Secondary | ICD-10-CM

## 2021-06-15 DIAGNOSIS — E611 Iron deficiency: Secondary | ICD-10-CM | POA: Diagnosis not present

## 2021-06-15 DIAGNOSIS — R140 Abdominal distension (gaseous): Secondary | ICD-10-CM

## 2021-06-15 DIAGNOSIS — K219 Gastro-esophageal reflux disease without esophagitis: Secondary | ICD-10-CM

## 2021-06-15 DIAGNOSIS — K625 Hemorrhage of anus and rectum: Secondary | ICD-10-CM

## 2021-06-15 MED ORDER — FAMOTIDINE 40 MG PO TABS: 40.0000 mg | ORAL_TABLET | Freq: Every day | ORAL | 5 refills | Status: DC

## 2021-06-15 NOTE — Progress Notes (Signed)
ASSESSMENT AND PLAN    #45 year old female with a 21-month history of episodic stabbing RUQ pain radiating through to her back and sometimes with associated nausea/vomiting.  Episodes of pain are random, not necessarily related to meals.  Rule out PUD in setting of NSAIDs.  Rule out biliary disease.  Of note she is also iron deficient, on iron replacement --For further evaluation patient will be scheduled for EGD. The risks and benefits of EGD with possible biopsies were discussed with the patient who agrees to proceed.  --If EGD negative, consider RUQ ultrasound  # Rectal bleeding, overall sounds low-volume and probably hemorrhoidal  She is iron deficient (without anemia) but probably related to heavy menstrual cycles.  However in the setting of rectal bleeding she will need a colonoscopy which can be done at the same time as EGD.  -- Patient will be scheduled for colonoscopy. The risks and benefits of colonoscopy with possible polypectomy / biopsies were discussed and the patient agrees to proceed.   # Iron deficiency without anemia.  Again, this is probably related to heavy menstrual cycles but further evaluation at time of EGD / colonoscopy   # Bloating / flatulence.  Further evaluation at time of EGD/colonoscopy.  No weight loss to suggest malabsorption. Given associated iron deficiency consider celiac disease.  Also consider SIBO.   #Chronic GERD.  On omeprazole every other day but still with frequent heartburn.  -- She has been on a PPI for years.  I have asked her to stop omeprazole and lets try Pepcid 40 mg daily --Anti-reflux measures discussed including avoidance of trigger foods and evening meals / bedtime snacks. If able, elevate head of bed 6-8 inches. If unable to elevate the head of the bed consider a wedge pillow.   Weight reduction / maintaining a healthy BMI) as increased abdominal girth is associated with reflux.    HISTORY OF PRESENT ILLNESS     Chief Complaint :  right upper abdominal pain ,nausea with some vomiting, bloating/flatulence, heartburn, change in stool color and odor, rectal bleeding  Maria Duran is a 45 y.o. female with a past medical history significant for polycystic ovary disease, IBS, GERD, anxiety, hyperlipidemia, appendectomy. See PMH below for any additional history.   **Patient is referred by PCP for abdominal pain, bloating, blood in stool  Maria Duran is known remotely to Dr. Leone Payor.  She had a colonoscopy in 2008 for evaluation of hematochezia.  Colonoscopy findings included hemorrhoids.  RUQ pain  / nausea / occasional vomiting:  Maria Duran gives a 53-month history of intermittent stabbing RUQ pain radiating through her back between the shoulder blades.  Episodes occur about once a month, can last for hours and be associated with nausea and vomiting.  She cannot relate the onset of pain to eating nor is it related to physical activity.  She has not had any associated weight loss.  She does take Motrin as needed for menstrual cramps.  Her LFTs are normal.  GERD:  Patient has a longstanding history of GERD.  It sounds like PCP had concerns about her long-term use of PPI and decreased omeprazole to every other day.  She gets frequent heartburn, especially on the days she does not take omeprazole.  No dysphagia.  Rectal bleeding:  Patient has been having rectal bleeding with bowel movements.  She sees the blood on the tissue, not on the stool itself.  She is not constipated nor having diarrhea.  However, her stools have recently turned green  with an unusual odor.  She cannot attribute the symptoms to any dietary or medication changes.  Maria Duran went to the ED for evaluation of RUQ pain on 06/10/2021.  Labs were drawn but she left prior to being seen.  Labs at [facility] ED showed a normal lipase, CBC, LFTs and negative pregnancy test   Data Reviewed:  Labs in April 2022 PCPs office show normal LFTs.  Normal renal function.  CBC normal including a  hemoglobin of 13.4, MCV 88.  Ferritin low at 16.4, TIBC 373  PREVIOUS GI EVALUATIONS:   July 2008 colonoscopy for hematochezia and family history of colon cancer.  Her grandfather had colon cancer in his 54s. --Complete exam, excellent bowel prep.  Loss of vascular pattern in the left colon.  Small hemorrhoids  COLON, LEFT, BIOPSIES: BENIGN COLONIC MUCOSA WITH FOCAL REACTIVE   LYMPHOID AGGREGATES. NO ACTIVE MUCOSAL INFLAMMATION, GRANULOMAS,   DYSPLASIA OR MALIGNANCY IDENTIFIED   Past Medical History:  Diagnosis Date   Acne rosacea    Asthma    Esophageal reflux    Family history of malignant neoplasm of gastrointestinal tract    Internal hemorrhoids without mention of complication    Personal history of other diseases of digestive system    Polycystic ovaries    Urticaria    Vestibular dysfunction    Mal de Debarquement syndrome      Past Surgical History:  Procedure Laterality Date   colonoscopy     ESOPHAGOGASTRODUODENOSCOPY     LAPAROSCOPIC APPENDECTOMY N/A 07/20/2019   Procedure: APPENDECTOMY LAPAROSCOPIC;  Surgeon: Emelia Loron, MD;  Location: [facility] OR;  Service: General;  Laterality: N/A;   Family History  Problem Relation Age of Onset   Hypertension Father    Hyperlipidemia Father    Alzheimer's disease Father    Migraines Brother    Crohn's disease Maternal Grandfather    Colon cancer Paternal Grandfather    Colon cancer Other        1st Degree Relative <60   Hyperlipidemia Other    Hypertension Other    Crohn's disease Other    Social History   Tobacco Use   Smoking status: Never   Smokeless tobacco: Never  Vaping Use   Vaping Use: Never used  Substance Use Topics   Alcohol use: Yes    Alcohol/week: 0.0 standard drinks    Comment: occ   Drug use: No   Current Outpatient Medications  Medication Sig Dispense Refill   Ascorbic Acid (VITAMIN C) 100 MG tablet Take 100 mg by mouth at bedtime.     Cholecalciferol (VITAMIN D3) 5000 units CAPS Take 1  capsule by mouth at bedtime.      clonazePAM (KLONOPIN) 0.5 MG tablet take 1 tablet by mouth every 8 hours if needed (Patient taking differently: Take 0.5 mg by mouth every 8 (eight) hours as needed for anxiety.) 270 tablet 1   Cyanocobalamin (VITAMIN B-12) 2500 MCG SUBL Take 1 tablet by mouth daily.      fluticasone (FLONASE) 50 MCG/ACT nasal spray Place 1 spray into both nostrils in the morning and at bedtime.     furosemide (LASIX) 20 MG tablet Take 20 mg by mouth daily as needed for fluid.      Iron Combinations (IRON COMPLEX PO) Take 1 tablet by mouth at bedtime.      MAGNESIUM PO Take 500 mg by mouth at bedtime.      omeprazole (PRILOSEC) 40 MG capsule Take 1 capsule (40 mg total) by mouth daily. (Patient  taking differently: Take 40 mg by mouth every other day.) 90 capsule 3   sertraline (ZOLOFT) 50 MG tablet Take 1 tablet (50 mg total) by mouth daily. 90 tablet 3   triamcinolone (NASACORT) 55 MCG/ACT AERO nasal inhaler Place 1 spray into the nose daily. (Patient not taking: Reported on 06/15/2021) 16.5 g 5   No current facility-administered medications for this visit.   Allergies  Allergen Reactions   Erythromycin Ethylsuccinate     REACTION: rash     Review of Systems: Positive for back pain, headaches, itching, menstrual pain, night sweats and swelling of feet and legs.  All other systems reviewed and negative except where noted in HPI.    PHYSICAL EXAM :    Wt Readings from Last 3 Encounters:  06/15/21 186 lb (84.4 kg)  06/13/21 184 lb 6.4 oz (83.6 kg)  07/21/19 184 lb (83.5 kg)    BP 108/66   Pulse 72   Ht 5\' 5"  (1.651 m)   Wt 186 lb (84.4 kg)   BMI 30.95 kg/m  Constitutional:  Pleasant female in no acute distress. Psychiatric: Normal mood and affect. Behavior is normal. EENT: Pupils normal.  Conjunctivae are normal. No scleral icterus. Neck supple.  Cardiovascular: Normal rate, regular rhythm. No edema Pulmonary/chest: Effort normal and breath sounds normal. No  wheezing, rales or rhonchi. Abdominal: Soft, nondistended, nontender. Bowel sounds active throughout. There are no masses palpable. No hepatomegaly. Neurological: Alert and oriented to person place and time. Skin: Skin is warm and dry. No rashes noted.  , NP  06/15/2021, 8:43 AM  Cc:  Referring Provider 08/15/2021., MD

## 2021-06-15 NOTE — Patient Instructions (Signed)
If you are age 45 or younger, your body mass index should be between 19-25. Your Body mass index is 30.95 kg/m. If this is out of the aformentioned range listed, please consider follow up with your Primary Care Provider.  __________________________________________________________  The [facility] providers would like to encourage you to use [facility] to communicate with providers for non-urgent requests or questions.  Due to long hold times on the telephone, sending your provider a message by [facility] may be a faster and more efficient way to get a response.  Please allow 48 business hours for a response.  Please remember that this is for non-urgent requests.   You have been scheduled for an endoscopy and colonoscopy. Please follow the written instructions given to you at your visit today. Please pick up your prep supplies at the pharmacy within the next 1-3 days. If you use inhalers (even only as needed), please bring them with you on the day of your procedure.  STOP Omeprazole  START Famotidine 40 mg 1 tablet daily  Follow up pending the results of your Endoscopy/Colonoscopy or as needed.  Thank you for entrusting me with your care and choosing [facility].  Willette Cluster, NP-C   Gastroesophageal Reflux Disease, Adult  Gastroesophageal reflux (GER) happens when acid from the stomach flows up into the tube that connects the mouth and the stomach (esophagus). Normally, food travels down the esophagus and stays in the stomach to be digested. With GER, food and stomach acid sometimes move back up into theesophagus. You may have a disease called gastroesophageal reflux disease (GERD) if the reflux: Happens often. Causes frequent or very bad symptoms. Causes problems such as damage to the esophagus. When this happens, the esophagus becomes sore and swollen. Over time, GERD can make small holes (ulcers) in the lining of the esophagus. What are the causes? This condition is caused by a  problem with the muscle between the esophagus and the stomach. When this muscle is weak or not normal, it does not close properlyto keep food and acid from coming back up from the stomach. The muscle can be weak because of: Tobacco use. Pregnancy. Having a certain type of hernia (hiatal hernia). Alcohol use. Certain foods and drinks, such as coffee, chocolate, onions, and peppermint. What increases the risk? Being overweight. Having a disease that affects your connective tissue. Taking NSAIDs, such a ibuprofen. What are the signs or symptoms? Heartburn. Difficult or painful swallowing. The feeling of having a lump in the throat. A bitter taste in the mouth. Bad breath. Having a lot of saliva. Having an upset or bloated stomach. Burping. Chest pain. Different conditions can cause chest pain. Make sure you see your doctor if you have chest pain. Shortness of breath or wheezing. A long-term cough or a cough at night. Wearing away of the surface of teeth (tooth enamel). Weight loss. How is this treated? Making changes to your diet. Taking medicine. Having surgery. Treatment will depend on how bad your symptoms are. Follow these instructions at home: Eating and drinking  Follow a diet as told by your doctor. You may need to avoid foods and drinks such as: Coffee and tea, with or without caffeine. Drinks that contain alcohol. Energy drinks and sports drinks. Bubbly (carbonated) drinks or sodas. Chocolate and cocoa. Peppermint and mint flavorings. Garlic and onions. Horseradish. Spicy and acidic foods. These include peppers, chili powder, curry powder, vinegar, hot sauces, and BBQ sauce. Citrus fruit juices and citrus fruits, such as oranges, lemons,  and limes. Tomato-based foods. These include red sauce, chili, salsa, and pizza with red sauce. Fried and fatty foods. These include donuts, french fries, potato chips, and high-fat dressings. High-fat meats. These include hot  dogs, rib eye steak, sausage, ham, and bacon. High-fat dairy items, such as whole milk, butter, and cream cheese. Eat small meals often. Avoid eating large meals. Avoid drinking large amounts of liquid with your meals. Avoid eating meals during the 2-3 hours before bedtime. Avoid lying down right after you eat. Do not exercise right after you eat.  Lifestyle  Do not smoke or use any products that contain nicotine or tobacco. If you need help quitting, ask your doctor. Try to lower your stress. If you need help doing this, ask your doctor. If you are overweight, lose an amount of weight that is healthy for you. Ask your doctor about a safe weight loss goal.  General instructions Pay attention to any changes in your symptoms. Take over-the-counter and prescription medicines only as told by your doctor. Do not take aspirin, ibuprofen, or other NSAIDs unless your doctor says it is okay. Wear loose clothes. Do not wear anything tight around your waist. Raise (elevate) the head of your bed about 6 inches (15 cm). You may need to use a wedge to do this. Avoid bending over if this makes your symptoms worse. Keep all follow-up visits. Contact a doctor if: You have new symptoms. You lose weight and you do not know why. You have trouble swallowing or it hurts to swallow. You have wheezing or a cough that keeps happening. You have a hoarse voice. Your symptoms do not get better with treatment. Get help right away if: You have sudden pain in your arms, neck, jaw, teeth, or back. You suddenly feel sweaty, dizzy, or light-headed. You have chest pain or shortness of breath. You vomit and the vomit is green, yellow, or black, or it looks like blood or coffee grounds. You faint. Your poop (stool) is red, bloody, or black. You cannot swallow, drink, or eat. These symptoms may represent a serious problem that is an emergency. Do not wait to see if the symptoms will go away. Get medical help right  away. Call your local emergency services (911 in the [place] Do not drive yourself to the hospital. Summary If a person has gastroesophageal reflux disease (GERD), food and stomach acid move back up into the esophagus and cause symptoms or problems such as damage to the esophagus. Treatment will depend on how bad your symptoms are. Follow a diet as told by your doctor. Take all medicines only as told by your doctor. This information is not intended to replace advice given to you by your health care provider. Make sure you discuss any questions you have with your healthcare provider. Document Revised: 05/02/2020 Document Reviewed: 05/02/2020 Elsevier Patient Education  2022 [employer].

## 2021-06-16 ENCOUNTER — Telehealth: Payer: Self-pay

## 2021-06-16 NOTE — Telephone Encounter (Signed)
Prior Authorization has been started for Famotidine 40 mg

## 2021-06-16 NOTE — Telephone Encounter (Signed)
Approved today Request Reference Number: [id number]. FAMOTIDINE TAB 40MG  is approved through 06/16/2022.  Your patient may now fill this prescription and it will be covered.

## 2021-06-30 ENCOUNTER — Other Ambulatory Visit (HOSPITAL_COMMUNITY): Payer: Self-pay | Admitting: Internal Medicine

## 2021-06-30 DIAGNOSIS — R109 Unspecified abdominal pain: Secondary | ICD-10-CM

## 2021-06-30 DIAGNOSIS — R1010 Upper abdominal pain, unspecified: Secondary | ICD-10-CM

## 2021-07-04 ENCOUNTER — Other Ambulatory Visit: Payer: Self-pay

## 2021-07-04 ENCOUNTER — Ambulatory Visit (HOSPITAL_COMMUNITY)
Admission: RE | Admit: 2021-07-04 | Discharge: 2021-07-04 | Disposition: A | Discharge status: Home / Self Care | Payer: 59 | Source: Ambulatory Visit | Attending: Internal Medicine | Admitting: Internal Medicine

## 2021-07-04 DIAGNOSIS — R1010 Upper abdominal pain, unspecified: Secondary | ICD-10-CM

## 2021-07-04 DIAGNOSIS — R109 Unspecified abdominal pain: Secondary | ICD-10-CM | POA: Diagnosis present

## 2021-07-06 ENCOUNTER — Telehealth: Payer: Self-pay | Admitting: Nurse Practitioner

## 2021-07-06 NOTE — Telephone Encounter (Signed)
Called x 2. No answer. Left message to call back on her voicemail.

## 2021-07-06 NOTE — Telephone Encounter (Signed)
Patient is returning your call.  

## 2021-07-06 NOTE — Telephone Encounter (Signed)
Patient called states she is having gallbladder issues and she would like to speak with a nurse about it since her procedure is coming up for a colonoscopy with Dr. Leone Payor.

## 2021-07-06 NOTE — Telephone Encounter (Signed)
The patient reports she had an abdominal u/s that was positive for gall stones, thickening of the gallbladder wall and she is being referred to Dr Dwain Sarna as of today. Does she still need the EGD? Her procedure date is 07/12/21.

## 2021-07-07 NOTE — Telephone Encounter (Signed)
Patient is aware of her options. She will stick with the current plan of both procedures.

## 2021-07-12 ENCOUNTER — Other Ambulatory Visit: Payer: Self-pay

## 2021-07-12 ENCOUNTER — Encounter: Payer: Self-pay | Admitting: Internal Medicine

## 2021-07-12 ENCOUNTER — Ambulatory Visit (AMBULATORY_SURGERY_CENTER): Payer: 59 | Admitting: Internal Medicine

## 2021-07-12 VITALS — BP 113/66 | HR 54 | Temp 98.5°F | Resp 16 | Ht 65.0 in | Wt 186.0 lb

## 2021-07-12 DIAGNOSIS — K625 Hemorrhage of anus and rectum: Secondary | ICD-10-CM

## 2021-07-12 DIAGNOSIS — K8020 Calculus of gallbladder without cholecystitis without obstruction: Secondary | ICD-10-CM | POA: Insufficient documentation

## 2021-07-12 DIAGNOSIS — D509 Iron deficiency anemia, unspecified: Secondary | ICD-10-CM

## 2021-07-12 DIAGNOSIS — E611 Iron deficiency: Secondary | ICD-10-CM

## 2021-07-12 DIAGNOSIS — K219 Gastro-esophageal reflux disease without esophagitis: Secondary | ICD-10-CM

## 2021-07-12 MED ORDER — SODIUM CHLORIDE 0.9 % IV SOLN: 500.0000 mL | Freq: Once | INTRAVENOUS | Status: DC

## 2021-07-12 NOTE — Patient Instructions (Addendum)
On the upper exam I saw some red color changes in the stomach and took biopsies to see if an infection is causing these changes. Be advised that many of [place] have these red color changes and it is not necessarily a problem.  I will let you know those results.  The colonoscopy exam was all ok. Hemorrhoids not swollen or inflamed but I suspect that is where the bleeding came from.  I appreciate the opportunity to care for you. Iva Boop, MD, [facility]  Repeat colonoscopy in 10 years for screening purposes.  Replete iron.  Consider GYN intervention for menorrhagia which appears to be cause of iron deficiency.   YOU HAD AN ENDOSCOPIC PROCEDURE TODAY AT THE [facility]:   Refer to the procedure report that was given to you for any specific questions about what was found during the examination.  If the procedure report does not answer your questions, please call your gastroenterologist to clarify.  If you requested that your care partner not be given the details of your procedure findings, then the procedure report has been included in a sealed envelope for you to review at your convenience later.  YOU SHOULD EXPECT: Some feelings of bloating in the abdomen. Passage of more gas than usual.  Walking can help get rid of the air that was put into your GI tract during the procedure and reduce the bloating. If you had a lower endoscopy (such as a colonoscopy or flexible sigmoidoscopy) you may notice spotting of blood in your stool or on the toilet paper. If you underwent a bowel prep for your procedure, you may not have a normal bowel movement for a few days.  Please Note:  You might notice some irritation and congestion in your nose or some drainage.  This is from the oxygen used during your procedure.  There is no need for concern and it should clear up in a day or so.  SYMPTOMS TO REPORT IMMEDIATELY:  Following lower endoscopy (colonoscopy or flexible sigmoidoscopy):  Excessive amounts of  blood in the stool  Significant tenderness or worsening of abdominal pains  Swelling of the abdomen that is new, acute  Fever of 100F or higher  Following upper endoscopy (EGD)  Vomiting of blood or coffee ground material  New chest pain or pain under the shoulder blades  Painful or persistently difficult swallowing  New shortness of breath  Fever of 100F or higher  Black, tarry-looking stools  For urgent or emergent issues, a gastroenterologist can be reached at any hour by calling [phone]. Do not use MyChart messaging for urgent concerns.    DIET:  We do recommend a small meal at first, but then you may proceed to your regular diet.  Drink plenty of fluids but you should avoid alcoholic beverages for 24 hours.  ACTIVITY:  You should plan to take it easy for the rest of today and you should NOT DRIVE or use heavy machinery until tomorrow (because of the sedation medicines used during the test).    FOLLOW UP: Our staff will call the number listed on your records 48-72 hours following your procedure to check on you and address any questions or concerns that you may have regarding the information given to you following your procedure. If we do not reach you, we will leave a message.  We will attempt to reach you two times.  During this call, we will ask if you have developed any symptoms of COVID 19. If you  develop any symptoms (ie: fever, flu-like symptoms, shortness of breath, cough etc.) before then, please call [phone].  If you test positive for Covid 19 in the 2 weeks post procedure, please call and report this information to [place].    If any biopsies were taken you will be contacted by phone or by letter within the next 1-3 weeks.  Please call us at [phone] if you have not heard about the biopsies in 3 weeks.    SIGNATURES/CONFIDENTIALITY: You and/or your care partner have signed paperwork which will be entered into your electronic medical record.  These signatures  attest to the fact that that the information above on your After Visit Summary has been reviewed and is understood.  Full responsibility of the confidentiality of this discharge information lies with you and/or your care-partner.

## 2021-07-12 NOTE — Progress Notes (Signed)
History and Physical Interval Note:  07/12/2021 1:22 PM  Maria Duran  has presented today for endoscopic procedure(s), with the diagnosis of  Encounter Diagnoses  Name Primary?   Iron deficiency Yes   Gastroesophageal reflux disease, unspecified whether esophagitis present    Rectal bleeding   .  The various methods of evaluation and treatment have been discussed with the patient and/or family. After consideration of risks, benefits and other options for treatment, the patient has consented to  the endoscopic procedure(s).   The patient's history has been reviewed, patient examined, no change in status, stable for endoscopic procedure(s).  I have reviewed the patient's chart and labs.  Questions were answered to the patient's satisfaction.     Iva Boop, MD, [facility]

## 2021-07-12 NOTE — Op Note (Signed)
[facility] Patient Name: Maria Duran Procedure Date: 07/12/2021 1:20 PM MRN: [id number] Endoscopist: Iva Boop , MD Age: 45 Referring MD:  Date of Birth: 1976-09-23 Gender: Female Account #: [id number] Procedure:                Colonoscopy Indications:              Iron deficiency anemia Medicines:                Propofol per Anesthesia, Monitored Anesthesia Care Procedure:                Pre-Anesthesia Assessment:                           - Prior to the procedure, a History and Physical                            was performed, and patient medications and                            allergies were reviewed. The patient's tolerance of                            previous anesthesia was also reviewed. The risks                            and benefits of the procedure and the sedation                            options and risks were discussed with the patient.                            All questions were answered, and informed consent                            was obtained. Prior Anticoagulants: The patient has                            taken no previous anticoagulant or antiplatelet                            agents. ASA Grade Assessment: II - A patient with                            mild systemic disease. After reviewing the risks                            and benefits, the patient was deemed in                            satisfactory condition to undergo the procedure.                           After obtaining informed consent, the colonoscope  was passed under direct vision. Throughout the                            procedure, the patient's blood pressure, pulse, and                            oxygen saturations were monitored continuously. The                            Olympus PCF-[id number] ([id number]) Colonoscope was                            introduced through the anus and advanced to the the                            terminal  ileum, with identification of the                            appendiceal orifice and IC valve. The colonoscopy                            was performed without difficulty. The patient                            tolerated the procedure well. The quality of the                            bowel preparation was excellent. The terminal                            ileum, ileocecal valve, appendiceal orifice, and                            rectum were photographed. Scope In: 1:35:22 PM Scope Out: 1:46:41 PM Scope Withdrawal Time: 0 hours 6 minutes 39 seconds  Total Procedure Duration: 0 hours 11 minutes 19 seconds  Findings:                 The perianal and digital rectal examinations were                            normal.                           The terminal ileum appeared normal.                           The entire examined colon appeared normal on direct                            and retroflexion views. Complications:            No immediate complications. Estimated Blood Loss:     Estimated blood loss: none. Impression:               - The examined portion of the ileum was normal.                           -  The entire examined colon is normal on direct and                            retroflexion views.                           - No specimens collected. Recommendation:           - Patient has a contact number available for                            emergencies. The signs and symptoms of potential                            delayed complications were discussed with the                            patient. Return to normal activities tomorrow.                            Written discharge instructions were provided to the                            patient.                           - Resume previous diet.                           - Continue present medications.                           - Repeat colonoscopy in 10 years for screening                            purposes.                            - replete iron                           consider GYN intervention for menorrhagia which                            appears to be cause of iron deficiency                           Has surgery evaluation for cholelithisais pending                           I will norify re: H pylori testing from stomach Iva Boop, MD 07/12/2021 1:57:10 PM This report has been signed electronically.

## 2021-07-12 NOTE — Progress Notes (Signed)
Called to room to assist during endoscopic procedure.  Patient ID and intended procedure confirmed with present staff. Received instructions for my participation in the procedure from the performing physician.  

## 2021-07-12 NOTE — Progress Notes (Signed)
VS completed by CW. ? ?Medical history reviewed and updated. ? ?

## 2021-07-12 NOTE — Op Note (Signed)
[facility] Patient Name: Maria Duran Procedure Date: 07/12/2021 1:20 PM MRN: [id number] Endoscopist: Iva Boop , MD Age: 45 Referring MD:  Date of Birth: 31-Mar-1976 Gender: Female Account #: [id number] Procedure:                Upper GI endoscopy Indications:              Iron deficiency anemia Medicines:                Propofol per Anesthesia, Monitored Anesthesia Care Procedure:                Pre-Anesthesia Assessment:                           - Prior to the procedure, a History and Physical                            was performed, and patient medications and                            allergies were reviewed. The patient's tolerance of                            previous anesthesia was also reviewed. The risks                            and benefits of the procedure and the sedation                            options and risks were discussed with the patient.                            All questions were answered, and informed consent                            was obtained. Prior Anticoagulants: The patient has                            taken no previous anticoagulant or antiplatelet                            agents. ASA Grade Assessment: II - A patient with                            mild systemic disease. After reviewing the risks                            and benefits, the patient was deemed in                            satisfactory condition to undergo the procedure.                           After obtaining informed consent, the endoscope was  passed under direct vision. Throughout the                            procedure, the patient's blood pressure, pulse, and                            oxygen saturations were monitored continuously. The                            Endoscope was introduced through the mouth, and                            advanced to the second part of duodenum. The upper                            GI  endoscopy was accomplished without difficulty.                            The patient tolerated the procedure well. Scope In: Scope Out: Findings:                 The examined esophagus was normal.                           Patchy mildly erythematous mucosa was found in the                            prepyloric region of the stomach. Biopsies were                            taken with a cold forceps for Helicobacter pylori                            testing using CLOtest. Verification of patient                            identification for the specimen was done. Estimated                            blood loss was minimal.                           The gastroesophageal flap valve was visualized                            endoscopically and classified as Hill Grade I                            (prominent fold, tight to endoscope).                           The cardia and gastric fundus were normal on                            retroflexion.  The examined duodenum was normal. Complications:            No immediate complications. Estimated Blood Loss:     Estimated blood loss was minimal. Impression:               - Normal esophagus.                           - Erythematous mucosa in the prepyloric region of                            the stomach. Biopsied.                           - Gastroesophageal flap valve classified as Hill                            Grade I (prominent fold, tight to endoscope).                           - Normal examined duodenum. Recommendation:           - Patient has a contact number available for                            emergencies. The signs and symptoms of potential                            delayed complications were discussed with the                            patient. Return to normal activities tomorrow.                            Written discharge instructions were provided to the                            patient.                            - See the other procedure note for documentation of                            additional recommendations.                           - Await pathology results. Iva Boop, MD 07/12/2021 1:54:22 PM This report has been signed electronically.

## 2021-07-12 NOTE — Progress Notes (Signed)
Report to PACU, RN, vss, BBS= Clear.  

## 2021-07-13 LAB — HELICOBACTER PYLORI SCREEN-BIOPSY: UREASE: NEGATIVE

## 2021-07-14 ENCOUNTER — Telehealth: Payer: Self-pay

## 2021-07-14 NOTE — Telephone Encounter (Signed)
  Follow up Call-  Call back number 07/12/2021  Post procedure Call Back phone  # [phone]  Permission to leave phone message Yes  Some recent data might be hidden     Patient questions:  Do you have a fever, pain , or abdominal swelling? Yes.   Pain Score  0 *  Have you tolerated food without any problems? Yes.    Have you been able to return to your normal activities? Yes.    Do you have any questions about your discharge instructions: Diet   No. Medications  No. Follow up visit  No.  Do you have questions or concerns about your Care? No.  Actions: * If pain score is 4 or above: No action needed, pain <4.

## 2021-07-14 NOTE — Telephone Encounter (Signed)
Attempted to reach patient for post-procedure f/u call. No answer. Left message that we will make another attempt to reach her later today and for her to please not hesitate to call us if she has any questions/concerns regarding her care. 

## 2021-08-15 ENCOUNTER — Ambulatory Visit: Payer: 59 | Admitting: Allergy & Immunology

## 2021-08-21 ENCOUNTER — Other Ambulatory Visit: Payer: Self-pay | Admitting: General Surgery

## 2021-08-21 NOTE — Progress Notes (Addendum)
Surgical Instructions   Your procedure is scheduled on Tuesday 08/29/2021.  Report to [facility] Main Entrance "A" at 05:30 A.M., then check in with the Admitting office.  Call [phone] if you have problems or questions between now and the morning of surgery:   Remember: Do not eat after midnight the night before your surgery  You may drink clear liquids until 04:30 the morning of your surgery.   Clear liquids allowed are: Water, Non-Citrus Juices (without pulp), Carbonated Beverages, Clear Tea, Black Coffee Only (NO MILK, CREAM, or POWDERED CREAMER of any kind), and Gatorade  Please complete your PRE-SURGERY ENSURE that was provided to you by 04:30am the morning of surgery.  Please, if able, drink it in one sitting. DO NOT SIP.    Take these medicines the morning of surgery with A SIP OF WATER:  Sertraline (Zoloft) Triamcinolone (Nasacort)  If needed you may take these medications the morning of surgery: Clonazepam (Klonopin)   As of today, STOP taking any Aspirin (unless otherwise instructed by your surgeon) or Aspirin-containing products; NSAIDS - Aleve, Naproxen, Ibuprofen, Motrin, Advil, Goody's, BC's, all herbal medications, fish oil, and all vitamins.   After your COVID test/prior to surgery   You are not required to quarantine however you are required to wear a well-fitting mask when you are out and around people not in your household.  If your mask becomes wet or soiled, replace with a new one.  Wash your hands often with soap and water for 20 seconds or clean your hands with an alcohol-based hand sanitizer that contains at least 60% alcohol.  Do not share personal items.  Notify your provider: if you are in close contact with someone who has COVID  or if you develop a fever of 100.4 or greater, sneezing, cough, sore throat, shortness of breath or body aches.          Do not wear jewelry or makeup  Do not wear lotions, powders, perfumes/colognes, or  deodorant.  Do not shave 48 hours prior to surgery.  Men may shave face and neck.  Do not wear nail polish, gel polish, artificial nails, or any other type of covering on natural nails including fingernails and toenails. If patients have artificial nails, gel coating, etc. that need to be removed by a nail salon please have this removed prior to surgery or surgery may need to be canceled/delayed if the surgeon/ anesthesia feels like the patient is unable to be adequately monitored.  Do not bring valuables to the hospital - [facility] is not responsible for any belongings or valuables.  Do NOT Smoke (Tobacco/Vaping) or drink Alcohol 24 hours prior to your procedure  If you use a CPAP at night, you may bring all equipment for your overnight stay.   Contacts, glasses, hearing aids, dentures or partials may not be worn into surgery, please bring cases for these belongings   For patients admitted to the hospital, discharge time will be determined by your treatment team.   Patients discharged the day of surgery will not be allowed to drive home, and someone needs to stay with them for 24 hours.  NO VISITORS WILL BE ALLOWED IN PRE-OP WHERE PATIENTS ARE PREPPED FOR SURGERY.  ONLY 1 SUPPORT PERSON MAY BE PRESENT IN THE WAITING ROOM WHILE YOU ARE IN SURGERY.  IF YOU ARE TO BE ADMITTED, ONCE YOU ARE IN YOUR ROOM YOU WILL BE ALLOWED TWO (2) VISITORS. 1 (ONE) VISITOR MAY STAY OVERNIGHT BUT MUST ARRIVE TO THE  ROOM BY 8pm.  Minor children may have two parents present. Special consideration for safety and communication needs will be reviewed on a case by case basis.  Special instructions:    Oral Hygiene is also important to reduce your risk of infection.  Remember - BRUSH YOUR TEETH THE MORNING OF SURGERY WITH YOUR REGULAR TOOTHPASTE   [facility]- Preparing For Surgery  Before surgery, you can play an important role. Because skin is not sterile, your skin needs to be as free of germs as possible. You  can reduce the number of germs on your skin by washing with CHG (chlorahexidine gluconate) Soap before surgery.  CHG is an antiseptic cleaner which kills germs and bonds with the skin to continue killing germs even after washing.     Please do not use if you have an allergy to CHG or antibacterial soaps. If your skin becomes reddened/irritated stop using the CHG.  Do not shave (including legs and underarms) for at least 48 hours prior to first CHG shower. It is OK to shave your face.  Please follow these instructions carefully.     Shower the NIGHT BEFORE SURGERY and the MORNING OF SURGERY with CHG Soap.   If you chose to wash your hair, wash your hair first as usual with your normal shampoo. After you shampoo, rinse your hair and body thoroughly to remove the shampoo.    Then [employer] and genitals (private parts) with your normal soap and rinse thoroughly to remove soap.  Next use the CHG Soap as you would any other liquid soap. You can apply CHG directly to the skin and wash gently with a clean washcloth.   Apply the CHG Soap to your body ONLY FROM THE NECK DOWN.  Do not use on open wounds or open sores. Avoid contact with your eyes, ears, mouth and genitals (private parts). Wash Face and genitals (private parts)  with your normal soap.   Wash thoroughly, paying special attention to the area where your surgery will be performed.  Thoroughly rinse your body with warm water from the neck down.  DO NOT shower/wash with your normal soap after using and rinsing off the CHG Soap.  Pat yourself dry with a CLEAN TOWEL.  Wear CLEAN PAJAMAS to bed the night before surgery  Place CLEAN SHEETS on your bed the night before your surgery  DO NOT SLEEP WITH PETS.   Day of Surgery:  Take a shower with CHG soap. Wear Clean/Comfortable clothing the morning of surgery Do not apply any deodorants/lotions.   Remember to brush your teeth WITH YOUR REGULAR TOOTHPASTE.   Please read over the  following fact sheets that you were given.

## 2021-08-22 ENCOUNTER — Encounter (HOSPITAL_COMMUNITY): Payer: Self-pay

## 2021-08-22 ENCOUNTER — Encounter (HOSPITAL_COMMUNITY)
Admission: RE | Admit: 2021-08-22 | Discharge: 2021-08-22 | Disposition: A | Discharge status: Home / Self Care | Payer: 59 | Source: Ambulatory Visit | Attending: General Surgery | Admitting: General Surgery

## 2021-08-22 ENCOUNTER — Other Ambulatory Visit: Payer: Self-pay

## 2021-08-22 DIAGNOSIS — Z01812 Encounter for preprocedural laboratory examination: Secondary | ICD-10-CM | POA: Insufficient documentation

## 2021-08-22 HISTORY — DX: Anxiety disorder, unspecified: F41.9

## 2021-08-22 HISTORY — DX: Depression, unspecified: F32.A

## 2021-08-22 HISTORY — DX: Anemia, unspecified: D64.9

## 2021-08-22 LAB — CBC
HCT: 39.4 % (ref 36.0–46.0)
Hemoglobin: 13.3 g/dL (ref 12.0–15.0)
MCH: 30.6 pg (ref 26.0–34.0)
MCHC: 33.8 g/dL (ref 30.0–36.0)
MCV: 90.8 fL (ref 80.0–100.0)
Platelets: 332 10*3/uL (ref 150–400)
RBC: 4.34 MIL/uL (ref 3.87–5.11)
RDW: 13 % (ref 11.5–15.5)
WBC: 6.4 10*3/uL (ref 4.0–10.5)
nRBC: 0 % (ref 0.0–0.2)

## 2021-08-22 NOTE — Progress Notes (Signed)
PCP - Dr. Martha Clan Cardiologist - patient denies  PPM/ICD - n/a Device Orders -  Rep Notified -   Chest x-ray - n/a EKG - n/a Stress Test - patient denies ECHO - patient denies Cardiac Cath - patient denies  Sleep Study - patient denies CPAP -   Fasting Blood Sugar - n/a Checks Blood Sugar _____ times a day  Blood Thinner Instructions: n/a Aspirin Instructions: n/a  ERAS Protcol - clears until 0430 PRE-SURGERY Ensure or G2- Ensure provided per order  COVID TEST- ambulatory surgery   Anesthesia review: n/a  Patient denies shortness of breath, fever, cough and chest pain at PAT appointment   All instructions explained to the patient, with a verbal understanding of the material. Patient agrees to go over the instructions while at home for a better understanding. Patient also instructed to self quarantine after being tested for COVID-19. The opportunity to ask questions was provided.

## 2021-08-28 NOTE — Anesthesia Preprocedure Evaluation (Addendum)
Anesthesia Evaluation  Patient identified by MRN, date of birth, ID band Patient awake    Reviewed: Allergy & Precautions, NPO status , Patient's Chart, lab work & pertinent test results  Airway Mallampati: II  TM Distance: >3 FB Neck ROM: Full    Dental no notable dental hx. (+) Teeth Intact, Dental Advisory Given   Pulmonary asthma ,    Pulmonary exam normal breath sounds clear to auscultation       Cardiovascular negative cardio ROS Normal cardiovascular exam Rhythm:Regular Rate:Normal     Neuro/Psych PSYCHIATRIC DISORDERS Anxiety Depression negative neurological ROS     GI/Hepatic Neg liver ROS, GERD  ,  Endo/Other  negative endocrine ROS  Renal/GU negative Renal ROS  negative genitourinary   Musculoskeletal negative musculoskeletal ROS (+)   Abdominal   Peds  Hematology negative hematology ROS (+)   Anesthesia Other Findings   Reproductive/Obstetrics                            Anesthesia Physical Anesthesia Plan  ASA: 2  Anesthesia Plan: General and Regional   Post-op Pain Management:  Regional for Post-op pain   Induction: Intravenous  PONV Risk Score and Plan: 3 and Midazolam, Dexamethasone and Ondansetron  Airway Management Planned: Oral ETT  Additional Equipment:   Intra-op Plan:   Post-operative Plan: Extubation in OR  Informed Consent: I have reviewed the patients History and Physical, chart, labs and discussed the procedure including the risks, benefits and alternatives for the proposed anesthesia with the patient or authorized representative who has indicated his/her understanding and acceptance.     Dental advisory given  Plan Discussed with: CRNA  Anesthesia Plan Comments:         Anesthesia Quick Evaluation

## 2021-08-29 ENCOUNTER — Encounter (HOSPITAL_COMMUNITY)
Admission: RE | Disposition: A | Discharge status: Home / Self Care | Payer: Self-pay | Source: Home / Self Care | Attending: General Surgery

## 2021-08-29 ENCOUNTER — Encounter (HOSPITAL_COMMUNITY): Payer: Self-pay | Admitting: General Surgery

## 2021-08-29 ENCOUNTER — Ambulatory Visit (HOSPITAL_COMMUNITY): Payer: 59 | Admitting: Anesthesiology

## 2021-08-29 ENCOUNTER — Other Ambulatory Visit: Payer: Self-pay

## 2021-08-29 ENCOUNTER — Ambulatory Visit (HOSPITAL_COMMUNITY)
Admission: RE | Admit: 2021-08-29 | Discharge: 2021-08-29 | Disposition: A | Discharge status: Home / Self Care | Payer: 59 | Attending: General Surgery | Admitting: General Surgery

## 2021-08-29 DIAGNOSIS — Z881 Allergy status to other antibiotic agents status: Secondary | ICD-10-CM | POA: Insufficient documentation

## 2021-08-29 DIAGNOSIS — K8044 Calculus of bile duct with chronic cholecystitis without obstruction: Secondary | ICD-10-CM | POA: Diagnosis present

## 2021-08-29 DIAGNOSIS — Z79899 Other long term (current) drug therapy: Secondary | ICD-10-CM | POA: Insufficient documentation

## 2021-08-29 HISTORY — PX: CHOLECYSTECTOMY: SHX55

## 2021-08-29 LAB — POCT PREGNANCY, URINE: Preg Test, Ur: NEGATIVE

## 2021-08-29 SURGERY — LAPAROSCOPIC CHOLECYSTECTOMY WITH INTRAOPERATIVE CHOLANGIOGRAM
Anesthesia: Regional | Site: Abdomen

## 2021-08-29 MED ORDER — CHLORHEXIDINE GLUCONATE 0.12 % MT SOLN
15.0000 mL | Freq: Once | OROMUCOSAL | Status: AC
Start: 2021-08-29 — End: 2021-08-29
  Administered 2021-08-29: 15 mL via OROMUCOSAL
  Filled 2021-08-29: qty 15

## 2021-08-29 MED ORDER — ROCURONIUM BROMIDE 10 MG/ML (PF) SYRINGE
PREFILLED_SYRINGE | INTRAVENOUS | Status: AC
  Filled 2021-08-29: qty 10

## 2021-08-29 MED ORDER — BUPIVACAINE-EPINEPHRINE 0.25% -1:200000 IJ SOLN
INTRAMUSCULAR | Status: DC | PRN
  Administered 2021-08-29: 7 mL

## 2021-08-29 MED ORDER — ONDANSETRON HCL 4 MG/2ML IJ SOLN
INTRAMUSCULAR | Status: AC
  Filled 2021-08-29: qty 2

## 2021-08-29 MED ORDER — FENTANYL CITRATE (PF) 250 MCG/5ML IJ SOLN
INTRAMUSCULAR | Status: DC | PRN
  Administered 2021-08-29: 100 ug via INTRAVENOUS
  Administered 2021-08-29 (×3): 50 ug via INTRAVENOUS

## 2021-08-29 MED ORDER — CHLORHEXIDINE GLUCONATE CLOTH 2 % EX PADS: 6.0000 | MEDICATED_PAD | Freq: Once | CUTANEOUS | Status: DC

## 2021-08-29 MED ORDER — ACETAMINOPHEN 500 MG PO TABS
1000.0000 mg | ORAL_TABLET | ORAL | Status: AC
  Administered 2021-08-29: 1000 mg via ORAL

## 2021-08-29 MED ORDER — LACTATED RINGERS IV SOLN
INTRAVENOUS | Status: DC
Start: 2021-08-29 — End: 2021-08-29

## 2021-08-29 MED ORDER — FENTANYL CITRATE (PF) 100 MCG/2ML IJ SOLN
INTRAMUSCULAR | Status: AC
  Filled 2021-08-29: qty 2

## 2021-08-29 MED ORDER — PHENYLEPHRINE 40 MCG/ML (10ML) SYRINGE FOR IV PUSH (FOR BLOOD PRESSURE SUPPORT)
PREFILLED_SYRINGE | INTRAVENOUS | Status: AC
  Filled 2021-08-29: qty 10

## 2021-08-29 MED ORDER — MIDAZOLAM HCL 5 MG/5ML IJ SOLN
INTRAMUSCULAR | Status: DC | PRN
  Administered 2021-08-29: 2 mg via INTRAVENOUS

## 2021-08-29 MED ORDER — ENSURE PRE-SURGERY PO LIQD: 296.0000 mL | Freq: Once | ORAL | Status: DC

## 2021-08-29 MED ORDER — MIDAZOLAM HCL 2 MG/2ML IJ SOLN
INTRAMUSCULAR | Status: AC
  Filled 2021-08-29: qty 2

## 2021-08-29 MED ORDER — GLYCOPYRROLATE PF 0.2 MG/ML IJ SOSY
PREFILLED_SYRINGE | INTRAMUSCULAR | Status: DC | PRN
  Administered 2021-08-29: .1 mg via INTRAVENOUS

## 2021-08-29 MED ORDER — ACETAMINOPHEN 500 MG PO TABS
1000.0000 mg | ORAL_TABLET | Freq: Once | ORAL | Status: DC
  Filled 2021-08-29: qty 2

## 2021-08-29 MED ORDER — ONDANSETRON HCL 4 MG/2ML IJ SOLN
INTRAMUSCULAR | Status: DC | PRN
  Administered 2021-08-29: 4 mg via INTRAVENOUS

## 2021-08-29 MED ORDER — ORAL CARE MOUTH RINSE
15.0000 mL | Freq: Once | OROMUCOSAL | Status: AC
Start: 2021-08-29 — End: 2021-08-29

## 2021-08-29 MED ORDER — PROPOFOL 10 MG/ML IV BOLUS
INTRAVENOUS | Status: AC
  Filled 2021-08-29: qty 20

## 2021-08-29 MED ORDER — LIDOCAINE 2% (20 MG/ML) 5 ML SYRINGE
INTRAMUSCULAR | Status: AC
  Filled 2021-08-29: qty 5

## 2021-08-29 MED ORDER — FENTANYL CITRATE (PF) 100 MCG/2ML IJ SOLN
25.0000 ug | INTRAMUSCULAR | Status: DC | PRN
  Administered 2021-08-29 (×3): 50 ug via INTRAVENOUS

## 2021-08-29 MED ORDER — FENTANYL CITRATE (PF) 250 MCG/5ML IJ SOLN
INTRAMUSCULAR | Status: AC
  Filled 2021-08-29: qty 5

## 2021-08-29 MED ORDER — DEXAMETHASONE SODIUM PHOSPHATE 10 MG/ML IJ SOLN
INTRAMUSCULAR | Status: DC | PRN
  Administered 2021-08-29 (×2): 5 mg
  Administered 2021-08-29: 4 mg

## 2021-08-29 MED ORDER — BUPIVACAINE-EPINEPHRINE (PF) 0.25% -1:200000 IJ SOLN
INTRAMUSCULAR | Status: AC
  Filled 2021-08-29: qty 30

## 2021-08-29 MED ORDER — ROCURONIUM BROMIDE 10 MG/ML (PF) SYRINGE
PREFILLED_SYRINGE | INTRAVENOUS | Status: DC | PRN
  Administered 2021-08-29: 70 mg via INTRAVENOUS

## 2021-08-29 MED ORDER — SUGAMMADEX SODIUM 200 MG/2ML IV SOLN
INTRAVENOUS | Status: DC | PRN
  Administered 2021-08-29: 170 mg via INTRAVENOUS

## 2021-08-29 MED ORDER — PROPOFOL 10 MG/ML IV BOLUS
INTRAVENOUS | Status: DC | PRN
  Administered 2021-08-29: 150 mg via INTRAVENOUS

## 2021-08-29 MED ORDER — SODIUM CHLORIDE 0.9 % IR SOLN
Status: DC | PRN
  Administered 2021-08-29: 1000 mL

## 2021-08-29 MED ORDER — LIDOCAINE 2% (20 MG/ML) 5 ML SYRINGE
INTRAMUSCULAR | Status: DC | PRN
  Administered 2021-08-29: 20 mg via INTRAVENOUS

## 2021-08-29 MED ORDER — OXYCODONE HCL 5 MG PO TABS: 5.0000 mg | ORAL_TABLET | Freq: Four times a day (QID) | ORAL | 0 refills | Status: DC | PRN

## 2021-08-29 MED ORDER — ROPIVACAINE HCL 5 MG/ML IJ SOLN
INTRAMUSCULAR | Status: DC | PRN
  Administered 2021-08-29 (×2): 24 mL via PERINEURAL

## 2021-08-29 MED ORDER — 0.9 % SODIUM CHLORIDE (POUR BTL) OPTIME
TOPICAL | Status: DC | PRN
  Administered 2021-08-29: 1000 mL

## 2021-08-29 MED ORDER — CELECOXIB 200 MG PO CAPS
400.0000 mg | ORAL_CAPSULE | ORAL | Status: AC
  Administered 2021-08-29: 400 mg via ORAL
  Filled 2021-08-29: qty 2

## 2021-08-29 MED ORDER — DEXAMETHASONE SODIUM PHOSPHATE 10 MG/ML IJ SOLN
INTRAMUSCULAR | Status: AC
  Filled 2021-08-29: qty 1

## 2021-08-29 MED ORDER — PHENYLEPHRINE 40 MCG/ML (10ML) SYRINGE FOR IV PUSH (FOR BLOOD PRESSURE SUPPORT)
PREFILLED_SYRINGE | INTRAVENOUS | Status: DC | PRN
  Administered 2021-08-29 (×2): 80 ug via INTRAVENOUS

## 2021-08-29 MED ORDER — INDOCYANINE GREEN 25 MG IV SOLR
1.0000 mg | Freq: Once | INTRAVENOUS | Status: AC
  Administered 2021-08-29: 3.75 mg via INTRAVENOUS
  Filled 2021-08-29: qty 10

## 2021-08-29 MED ORDER — CEFAZOLIN SODIUM-DEXTROSE 2-4 GM/100ML-% IV SOLN
2.0000 g | INTRAVENOUS | Status: AC
  Administered 2021-08-29: 2 g via INTRAVENOUS
  Filled 2021-08-29: qty 100

## 2021-08-29 SURGICAL SUPPLY — 41 items
ADH SKN CLS APL DERMABOND .7 (GAUZE/BANDAGES/DRESSINGS) ×1
APL PRP STRL LF DISP 70% ISPRP (MISCELLANEOUS) ×1
APPLIER CLIP 5 13 M/L LIGAMAX5 (MISCELLANEOUS) ×2
APR CLP MED LRG 5 ANG JAW (MISCELLANEOUS) ×1
BAG COUNTER SPONGE SURGICOUNT (BAG) ×2 IMPLANT
BAG SPEC RTRVL 10 TROC 200 (ENDOMECHANICALS) ×1
BAG SPNG CNTER NS LX DISP (BAG) ×1
CANISTER SUCT 3000ML PPV (MISCELLANEOUS) ×2 IMPLANT
CHLORAPREP W/TINT 26 (MISCELLANEOUS) ×2 IMPLANT
CLIP APPLIE 5 13 M/L LIGAMAX5 (MISCELLANEOUS) ×1 IMPLANT
COVER MAYO STAND STRL (DRAPES) IMPLANT
COVER SURGICAL LIGHT HANDLE (MISCELLANEOUS) ×2 IMPLANT
DERMABOND ADVANCED (GAUZE/BANDAGES/DRESSINGS) ×1
DERMABOND ADVANCED .7 DNX12 (GAUZE/BANDAGES/DRESSINGS) ×1 IMPLANT
ELECT REM PT RETURN 9FT ADLT (ELECTROSURGICAL) ×2
ELECTRODE REM PT RTRN 9FT ADLT (ELECTROSURGICAL) ×1 IMPLANT
GLOVE SURG ENC MOIS LTX SZ7 (GLOVE) ×2 IMPLANT
GLOVE SURG UNDER POLY LF SZ7.5 (GLOVE) ×2 IMPLANT
GOWN STRL REUS W/ TWL LRG LVL3 (GOWN DISPOSABLE) ×3 IMPLANT
GOWN STRL REUS W/TWL LRG LVL3 (GOWN DISPOSABLE) ×6
GRASPER SUT TROCAR 14GX15 (MISCELLANEOUS) ×2 IMPLANT
KIT BASIN OR (CUSTOM PROCEDURE TRAY) ×2 IMPLANT
KIT IMAGING PINPOINTPAQ (MISCELLANEOUS) ×1 IMPLANT
KIT TURNOVER KIT B (KITS) ×2 IMPLANT
NS IRRIG 1000ML POUR BTL (IV SOLUTION) ×2 IMPLANT
PAD ARMBOARD 7.5X6 YLW CONV (MISCELLANEOUS) ×2 IMPLANT
POUCH RETRIEVAL ECOSAC 10 (ENDOMECHANICALS) ×1 IMPLANT
POUCH RETRIEVAL ECOSAC 10MM (ENDOMECHANICALS) ×2
SCISSORS LAP 5X35 DISP (ENDOMECHANICALS) ×2 IMPLANT
SET IRRIG TUBING LAPAROSCOPIC (IRRIGATION / IRRIGATOR) ×2 IMPLANT
SET TUBE SMOKE EVAC HIGH FLOW (TUBING) ×2 IMPLANT
SLEEVE ENDOPATH XCEL 5M (ENDOMECHANICALS) ×4 IMPLANT
STRIP CLOSURE SKIN 1/2X4 (GAUZE/BANDAGES/DRESSINGS) ×2 IMPLANT
SUT MNCRL AB 4-0 PS2 18 (SUTURE) ×2 IMPLANT
SUT VICRYL 0 UR6 27IN ABS (SUTURE) ×2 IMPLANT
TOWEL GREEN STERILE (TOWEL DISPOSABLE) ×2 IMPLANT
TOWEL GREEN STERILE FF (TOWEL DISPOSABLE) ×2 IMPLANT
TRAY LAPAROSCOPIC MC (CUSTOM PROCEDURE TRAY) ×2 IMPLANT
TROCAR XCEL BLUNT TIP 100MML (ENDOMECHANICALS) ×2 IMPLANT
TROCAR XCEL NON-BLD 5MMX100MML (ENDOMECHANICALS) ×2 IMPLANT
WATER STERILE IRR 1000ML POUR (IV SOLUTION) ×2 IMPLANT

## 2021-08-29 NOTE — Discharge Instructions (Signed)
[facility] LAPAROSCOPIC SURGERY: POST OP INSTRUCTIONS  Always review your discharge instruction sheet given to you by the facility where your surgery was performed. IF YOU HAVE DISABILITY OR FAMILY LEAVE FORMS, YOU MUST BRING THEM TO THE OFFICE FOR PROCESSING.   DO NOT GIVE THEM TO YOUR DOCTOR.  A prescription for pain medication may be given to you upon discharge.  Take your pain medication as prescribed, if needed.  If narcotic pain medicine is not needed, then you may take acetaminophen (Tylenol), naprosyn (Alleve), or ibuprofen (Advil) as needed. Take your usually prescribed medications unless otherwise directed. If you need a refill on your pain medication, please contact your pharmacy.  They will contact our office to request authorization. Prescriptions will not be filled after 5pm or on week-ends. You should follow a light diet the first few days after arrival home, such as soup and crackers, etc.  Be sure to include lots of fluids daily. Most patients will experience some swelling and bruising in the area of the incisions.  Ice packs will help.  Swelling and bruising can take several days to resolve.  It is common to experience some constipation if taking pain medication after surgery.  Increasing fluid intake and taking a stool softener (such as Colace) will usually help or prevent this problem from occurring.  A mild laxative (Milk of Magnesia or Miralax) should be taken according to package instructions if there are no bowel movements after 48 hours. Unless discharge instructions indicate otherwise, you may remove your bandages 48 hours after surgery, and you may shower at that time.  You may have steri-strips (small skin tapes) in place directly over the incision.  These strips should be left on the skin for 7-10 days.  If your surgeon used skin glue on the incision, you may shower in 24 hours.  The glue will flake off over the next  2-3 weeks.  Any sutures or staples will be removed at the office during your follow-up visit. ACTIVITIES:  You may resume regular (light) daily activities beginning the next day--such as daily self-care, walking, climbing stairs--gradually increasing activities as tolerated.  You may have sexual intercourse when it is comfortable.  Refrain from any heavy lifting or straining until approved by your doctor. You may drive when you are no longer taking prescription pain medication, you can comfortably wear a seatbelt, and you can safely maneuver your car and apply brakes. RETURN TO WORK:  __________________________________________________________ You should see your doctor in the office for a follow-up appointment approximately 2-3 weeks after your surgery.  Make sure that you call for this appointment within a day or two after you arrive home to insure a convenient appointment time. OTHER INSTRUCTIONS: __________________________________________________________________________________________________________________________ __________________________________________________________________________________________________________________________ WHEN TO CALL YOUR DOCTOR: Fever over 101.0 Inability to urinate Continued bleeding from incision. Increased pain, redness, or drainage from the incision. Increasing abdominal pain  The clinic staff is available to answer your questions during regular business hours.  Please don't hesitate to call and ask to speak to one of the nurses for clinical concerns.  If you have a medical emergency, go to the nearest emergency room or call 911.  A surgeon from [facility] is always on call at the hospital. [address], [address], [place], [place]  [address] ? [address] [address], [place], [place]   [address] [phone] ? [phone] ? FAX [phone] Web site: [email or web address]  

## 2021-08-29 NOTE — Interval H&P Note (Signed)
History and Physical Interval Note:  08/29/2021 7:30 AM  Maria Duran  has presented today for surgery, with the diagnosis of GALLSTONES.  The various methods of treatment have been discussed with the patient and family. After consideration of risks, benefits and other options for treatment, the patient has consented to  Procedure(s) with comments: LAPAROSCOPIC CHOLECYSTECTOMY WITH ICG DYE (N/A) - GENERAL AND TAP BLOCK as a surgical intervention.  The patient's history has been reviewed, patient examined, no change in status, stable for surgery.  I have reviewed the patient's chart and labs.  Questions were answered to the patient's satisfaction.     Emelia Loron

## 2021-08-29 NOTE — Anesthesia Postprocedure Evaluation (Signed)
Anesthesia Post Note  Patient: Maria Duran  Procedure(s) Performed: LAPAROSCOPIC CHOLECYSTECTOMY WITH ICG DYE (Abdomen)     Patient location during evaluation: PACU Anesthesia Type: Regional and General Level of consciousness: awake and alert Pain management: pain level controlled Vital Signs Assessment: post-procedure vital signs reviewed and stable Respiratory status: spontaneous breathing, nonlabored ventilation, respiratory function stable and patient connected to nasal cannula oxygen Cardiovascular status: blood pressure returned to baseline and stable Postop Assessment: no apparent nausea or vomiting Anesthetic complications: no   No notable events documented.  Last Vitals:  Vitals:   08/29/21 0945 08/29/21 0956  BP: 115/71 114/73  Pulse: (!) 53 (!) 57  Resp: 13 14  Temp:  36.5 C  SpO2: 95% 96%    Last Pain:  Vitals:   08/29/21 0617  TempSrc:   PainSc: 0-No pain                 Kinney Sackmann L Argel Pablo

## 2021-08-29 NOTE — Anesthesia Procedure Notes (Signed)
Anesthesia Regional Block: Quadratus lumborum   Pre-Anesthetic Checklist: , timeout performed,  Correct Patient, Correct Site, Correct Laterality,  Correct Procedure, Correct Position, site marked,  Risks and benefits discussed,  Surgical consent,  Pre-op evaluation,  At surgeon's request and post-op pain management  Laterality: Right  Prep: Maximum Sterile Barrier Precautions used, chloraprep       Needles:  Injection technique: Single-shot  Needle Type: Echogenic Stimulator Needle     Needle Length: 9cm  Needle Gauge: 22     Additional Needles:   Procedures:,,,, ultrasound used (permanent image in chart),,    Narrative:  Start time: 08/29/2021 7:02 AM End time: 08/29/2021 7:06 AM Injection made incrementally with aspirations every 5 mL.  Performed by: Personally  Anesthesiologist: Elmer Picker, MD  Additional Notes: Monitors applied. No increased pain on injection. No increased resistance to injection. Injection made in 5cc increments. Good needle visualization. Patient tolerated procedure well.

## 2021-08-29 NOTE — Op Note (Signed)
Preoperative diagnosis: Biliary colice Postoperative diagnosis: SAA Procedure: Laparoscopic cholecystectomy Surgeon: Dr. Harden Mo Anesthesia: General Estimated blood loss: Minimal Complications: None Drains: None Specimens: Gallbladder and contents to pathology Sponge needle count was correct at completion Disposition to recovery stable condition  Indications: 45 y.o. female who is seen today for gallstones. I know her from a prior appendectomy in 2020. For the last year and a half she has had intermittent right upper quadrant pains radiating to her back associated with nausea and vomiting. She underwent an ultrasound that shows cholelithiasis measuring up to 6 mm with some mild gallbladder wall thickening. 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 We discussed laparoscopic cholecystectomy.   Procedure: After informed consent was obtained the patient was taken to the OR.  She was given antibiotics.  She had undergone a TAP block. SCDs were in place.  She was then placed under general anesthesia without complication.  She was prepped and draped in the standard sterile surgical fashion.  A surgical timeout was then performed.  I infiltrated Marcaine below the umbilicus.  I made a vertical incision.  I grasped the fascia and incised it sharply.  I entered the peritoneum bluntly.  There was no evidence of an entry injury.  I then placed a 0 Vicryl pursestring suture through the fascia.  I then placed a Hassan trocar and insufflated the abdomen to 15 mmHg pressure.  I then placed 3 additional 5 mm trocars in epigastrium and right side of the abdomen under direct vision without complication.  I retracted the gallbladder cephalad and lateral.  I did have her injected with ICG dye and used this to help identify the critical view of safety.  I was able to obtain the critical view of safety.  I then clipped the cystic duct.   I divided it leaving 2 clips in place.  The cystic duct was viable  and the clips completely traversed the duct.  I treated the artery in a similar fashion. The gallbladder is then removed from the liver bed.  I then placed the gallbladder in retrieval bag. I removed all of this from the umbilical trocar site.  Hemostasis was obtained.  I then removed the [facility] trocar.  I tied my pursestring down.  I placed several additional 0 Vicryl sutures using the suture passer device to completely obliterate this defect.  I then desufflated the abdomen and remove the remaining trocars.  These were closed with 4-0 Monocryl and glue.  She tolerated this well was extubated transferred to recovery stable.

## 2021-08-29 NOTE — Transfer of Care (Signed)
Immediate Anesthesia Transfer of Care Note  Patient: [name]  Procedure(s) Performed: LAPAROSCOPIC CHOLECYSTECTOMY WITH ICG DYE (Abdomen)  Patient Location: PACU  Anesthesia Type:General and GA combined with regional for post-op pain  Level of Consciousness: awake and patient cooperative  Airway & Oxygen Therapy: Patient Spontanous Breathing and Patient connected to nasal cannula oxygen  Post-op Assessment: Report given to RN and Post -op Vital signs reviewed and stable  Post vital signs: Reviewed and stable  Last Vitals:  Vitals Value Taken Time  BP 112/72 08/29/21 0842  Temp 36.5 C 08/29/21 0842  Pulse 71 08/29/21 0844  Resp 19 08/29/21 0844  SpO2 100 % 08/29/21 0844  Vitals shown include unvalidated device data.  Last Pain:  Vitals:   08/29/21 0617  TempSrc:   PainSc: 0-No pain         Complications: No notable events documented.

## 2021-08-29 NOTE — Anesthesia Procedure Notes (Signed)
Anesthesia Regional Block: Quadratus lumborum   Pre-Anesthetic Checklist: , timeout performed,  Correct Patient, Correct Site, Correct Laterality,  Correct Procedure, Correct Position, site marked,  Risks and benefits discussed,  Surgical consent,  Pre-op evaluation,  At surgeon's request and post-op pain management  Laterality: Left  Prep: Maximum Sterile Barrier Precautions used, chloraprep       Needles:  Injection technique: Single-shot  Needle Type: Echogenic Stimulator Needle     Needle Length: 9cm  Needle Gauge: 22     Additional Needles:   Procedures:,,,, ultrasound used (permanent image in chart),,    Narrative:  Start time: 08/29/2021 7:06 AM End time: 08/29/2021 7:10 AM Injection made incrementally with aspirations every 5 mL.  Performed by: Personally  Anesthesiologist: Elmer Picker, MD  Additional Notes: Monitors applied. No increased pain on injection. No increased resistance to injection. Injection made in 5cc increments. Good needle visualization. Patient tolerated procedure well.

## 2021-08-29 NOTE — H&P (Signed)
45 y.o. female who is seen today for gallstones. I know her from a prior appendectomy in 2020. For the last year and a half she has had intermittent right upper quadrant pains radiating to her back associated with nausea and vomiting. These have become more frequent and more severe. The last was in the middle of August. She has changed her diet since then and has not had any more attacks. She does note some chronic soreness at that area. She underwent an ultrasound that shows cholelithiasis measuring up to 6 mm with some mild gallbladder wall thickening. 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 She is here to discuss this.  Review of Systems: A complete review of systems was obtained from the patient. I have reviewed this information and discussed as appropriate with the patient. See HPI as well for other ROS.  Review of Systems  Gastrointestinal: Positive for abdominal pain.    Medical History: Past Medical History:  Diagnosis Date   Anemia   Anxiety   GERD (gastroesophageal reflux disease)   There is no problem list on file for this patient.  Past Surgical History:  Procedure Laterality Date   APPENDECTOMY    Allergies  Allergen Reactions   Erythromycin Ethylsuccinate Rash  REACTION: rash REACTION: rash   Erythromycin Unknown   Current Outpatient Medications on File Prior to Visit  Medication Sig Dispense Refill   famotidine (PEPCID) 40 MG tablet Take 40 mg by mouth once daily   ascorbic acid, vitamin C, (VITAMIN C) 100 MG tablet Take by mouth   cholecalciferol (VITAMIN D3) 1000 unit tablet daily   clonazePAM (KLONOPIN) 0.5 MG tablet Take 0.5 mg by mouth 2 (two) times daily as needed   cyanocobalamin, vitamin B-12, (VITAMIN B-12) 2,500 mcg Subl Take 1 tablet by mouth once daily   iron polysaccharides (FERREX) 150 mg iron capsule Take 150 mg by mouth 2 (two) times daily   potassium bicarb-magnesium 21 (MAGNESIUM FIZZ-PLUS) 500-300 mg/6.1 gram PwEf Take by mouth    sertraline (ZOLOFT) 50 MG tablet Take 50 mg by mouth once daily   No current facility-administered medications on file prior to visit.   Family History  Problem Relation Age of Onset   Stroke Father   High blood pressure (Hypertension) Father   Hyperlipidemia (Elevated cholesterol) Father    Social History   Tobacco Use  Smoking Status Never Smoker  Smokeless Tobacco Never Used    Social History   Socioeconomic History   Marital status: Single  Tobacco Use   Smoking status: Never Smoker   Smokeless tobacco: Never Used  Building services engineer Use: Never used  Substance and Sexual Activity   Alcohol use: Yes   Drug use: Never   Objective:   Vitals:  08/03/21 0921  BP: 118/62  Pulse: 70  Temp: 36.9 C (98.5 F)  SpO2: 96%  Weight: 82.9 kg (182 lb 12.8 oz)  Height: 165.1 cm (5\' 5" )   Body mass index is 30.42 kg/m.  Physical Exam Constitutional:  Appearance: Normal appearance.  Cardiovascular:  Rate and Rhythm: Normal rate.  Pulmonary:  Effort: Pulmonary effort is normal.  Abdominal:  General: There is no distension.  Palpations: Abdomen is soft.  Tenderness: There is no abdominal tenderness.  Hernia: No hernia is present.  Skin: General: Skin is warm.  Coloration: Skin is not jaundiced.  Neurological:  General: No focal deficit present.  Mental Status: She is alert.     Assessment and Plan:   Calculus of  gallbladder with chronic cholecystitis without obstruction  Laparoscopic cholecystectomy  I do think symptoms are related to gallstones and we discussed indications for surgery I discussed the procedure in detail. We discussed the risks and benefits of a laparoscopic cholecystectomy and possible cholangiogram including, but not limited to bleeding, infection, injury to surrounding structures such as the intestine or liver, bile leak, retained gallstones, need to convert to an open procedure, prolonged diarrhea, blood clots such as DVT, common bile  duct injury, anesthesia risks, and possible need for additional procedures. The likelihood of improvement in symptoms and return to the patient's normal status is good. We discussed the typical post-operative recovery course.

## 2021-08-29 NOTE — Anesthesia Procedure Notes (Signed)
Procedure Name: Intubation Date/Time: 08/29/2021 7:43 AM Performed by: Adria Dill, CRNA Pre-anesthesia Checklist: Patient identified, Emergency Drugs available, Suction available and Patient being monitored Patient Re-evaluated:Patient Re-evaluated prior to induction Oxygen Delivery Method: Circle system utilized Preoxygenation: Pre-oxygenation with 100% oxygen Induction Type: IV induction Ventilation: Mask ventilation without difficulty Laryngoscope Size: Miller and 2 Grade View: Grade I Tube type: Oral Number of attempts: 1 Airway Equipment and Method: Stylet Placement Confirmation: ETT inserted through vocal cords under direct vision, positive ETCO2 and breath sounds checked- equal and bilateral Secured at: 21 cm Tube secured with: Tape Dental Injury: Teeth and Oropharynx as per pre-operative assessment

## 2021-08-30 ENCOUNTER — Encounter (HOSPITAL_COMMUNITY): Payer: Self-pay | Admitting: General Surgery

## 2021-08-30 LAB — SURGICAL PATHOLOGY

## 2021-12-21 IMAGING — US US ABDOMEN COMPLETE
1 series · 13 of 25 positions shown · non-contrast
Comparison: CT July 20, 2019

CLINICAL DATA: Two episodes of severe right upper quadrant pain.

EXAM:
ABDOMEN ULTRASOUND COMPLETE

[Series 1: us abdomen complete · 84 acquisitions, 13 frames shown]
[im 1/84]
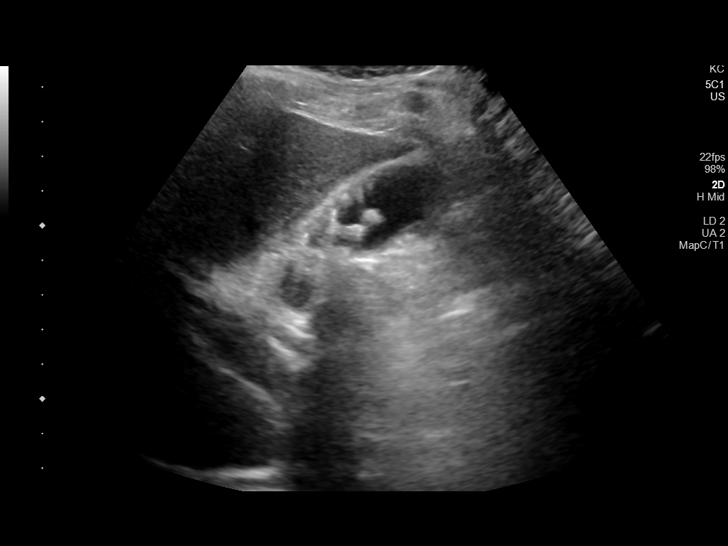
[im 7/84]
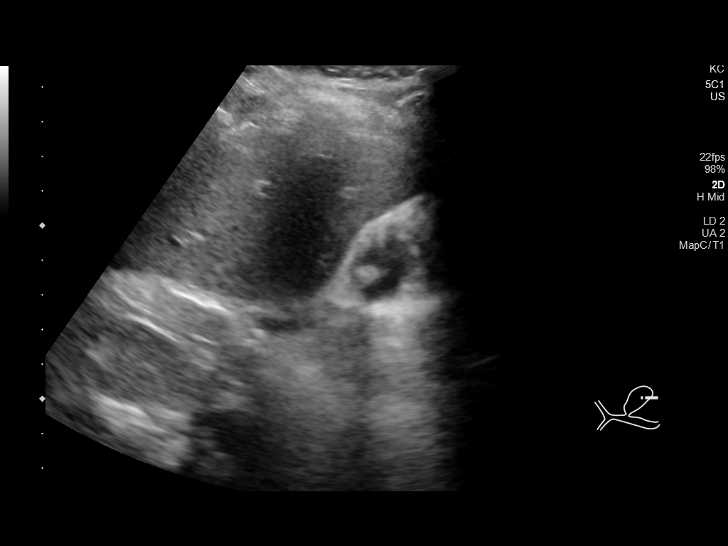
[im 14/84]
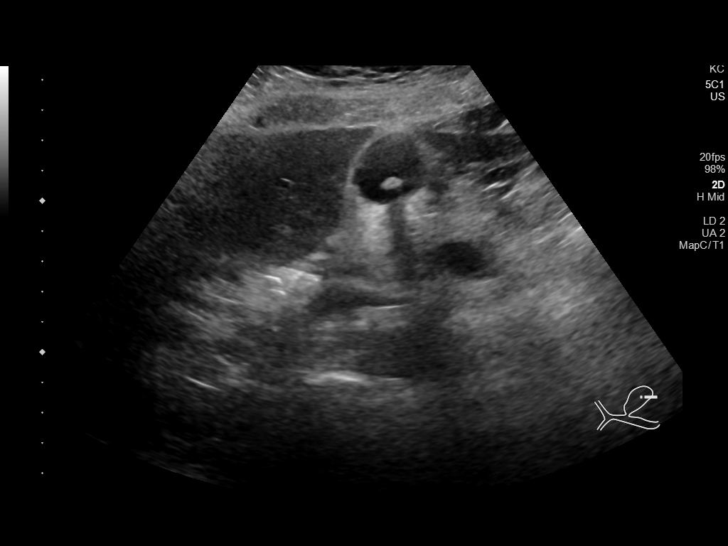
[im 21/84]
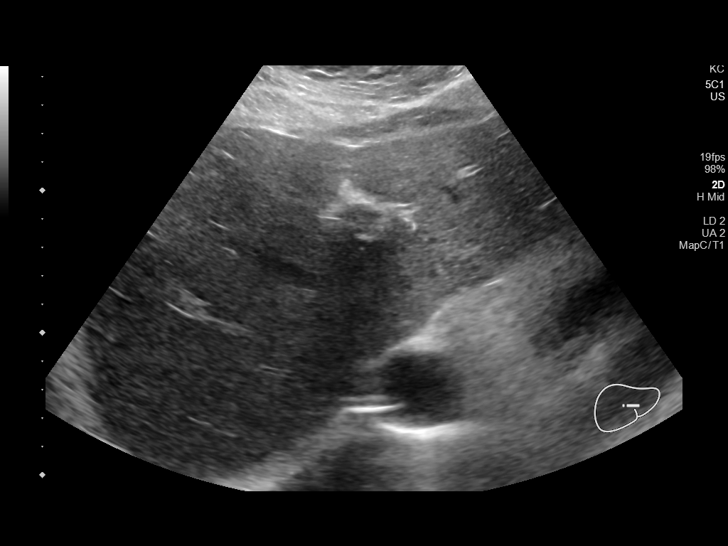
[im 28/84]
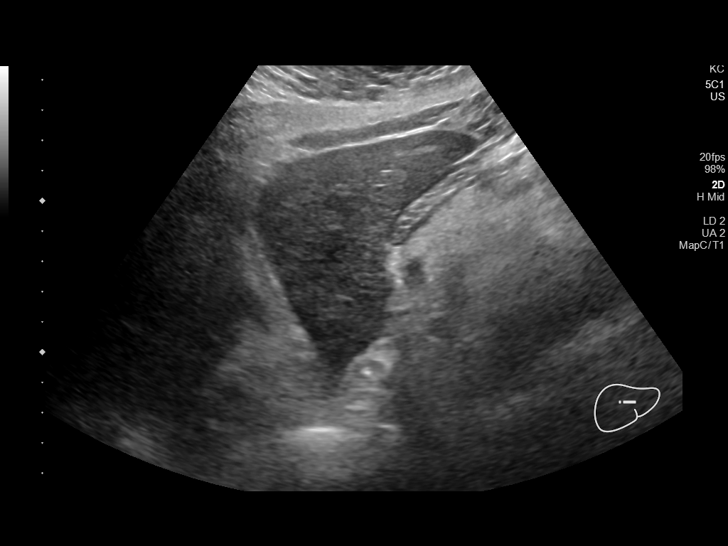
[im 35/84]
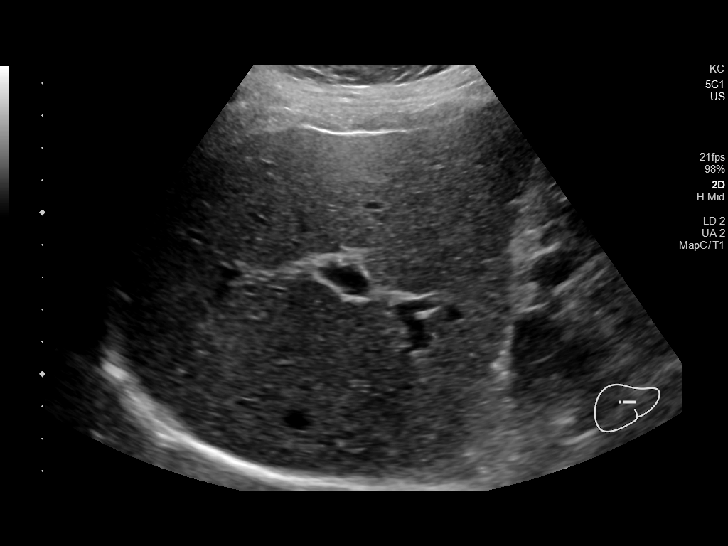
[im 42/84]
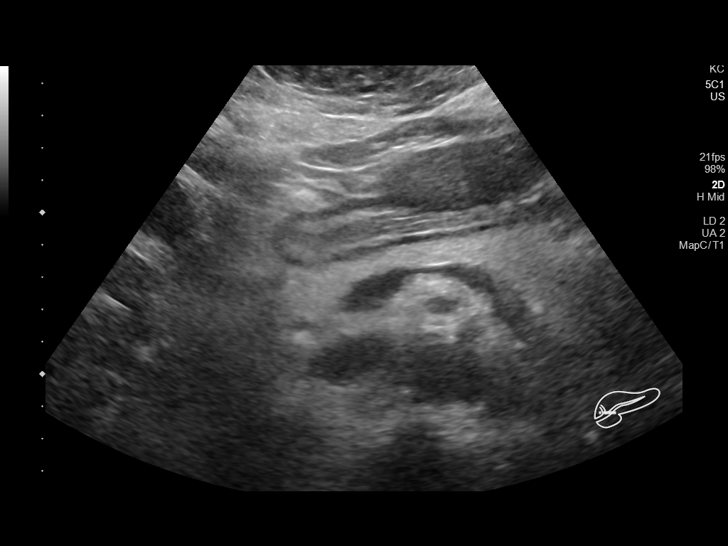
[im 49/84]
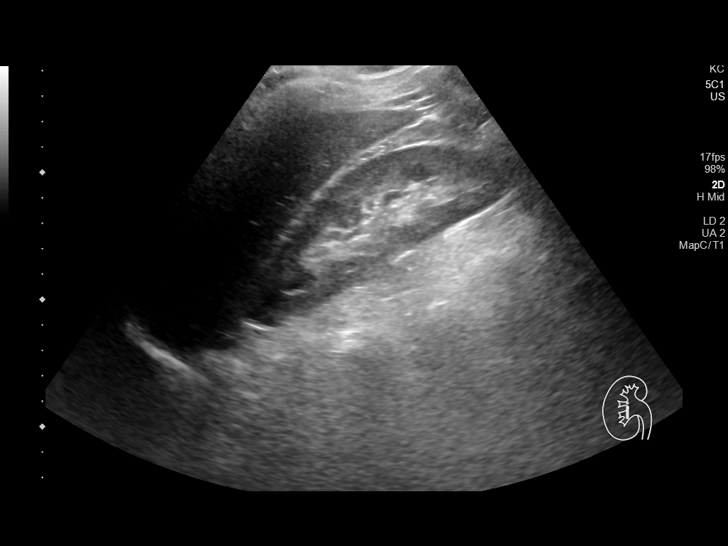
[im 56/84]
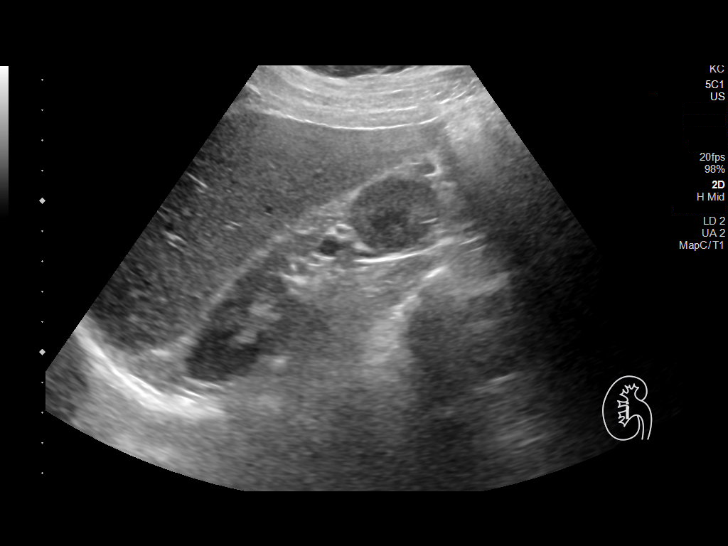
[im 63/84]
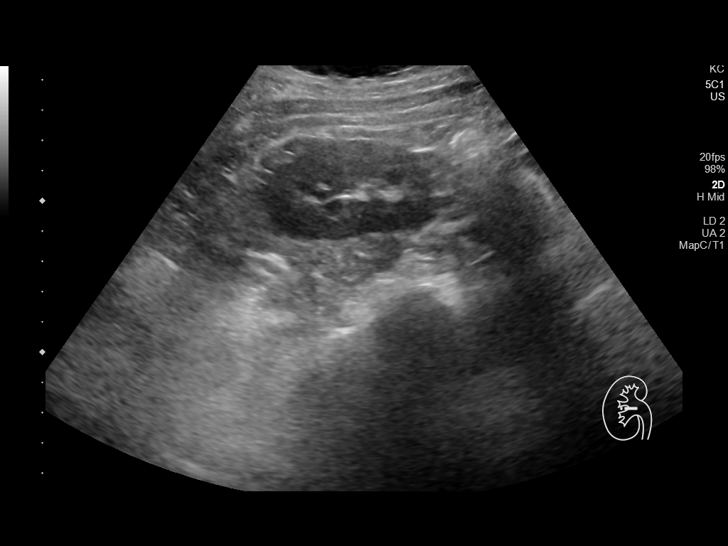
[im 70/84]
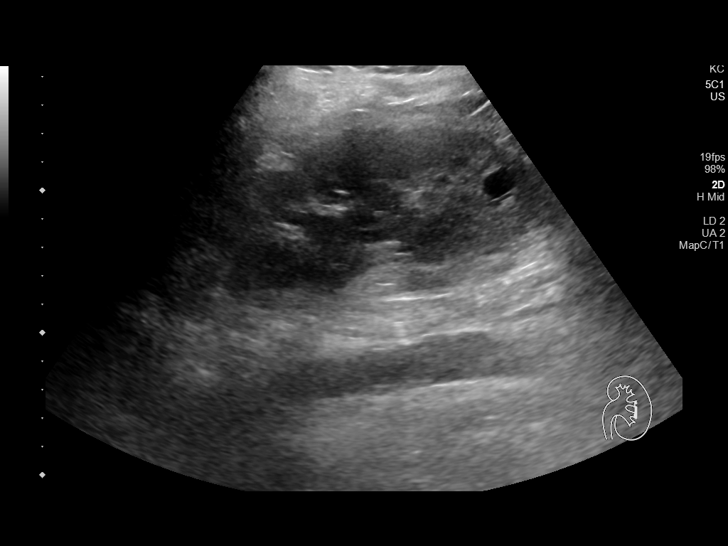
[im 77/84]
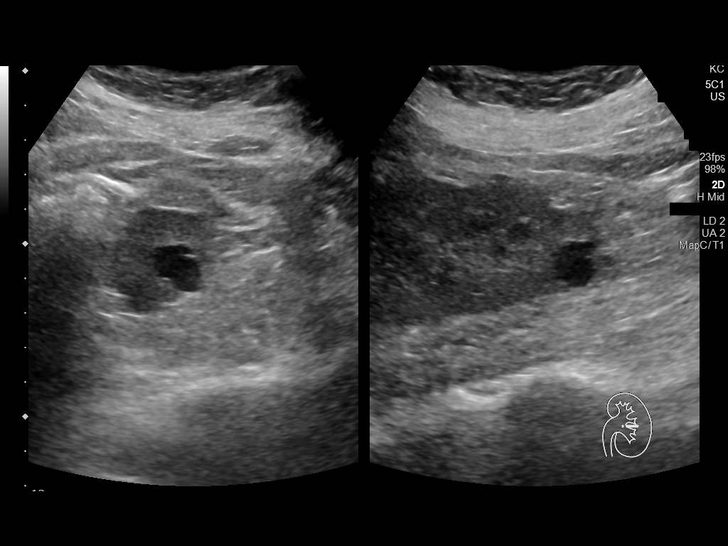
[im 84/84]
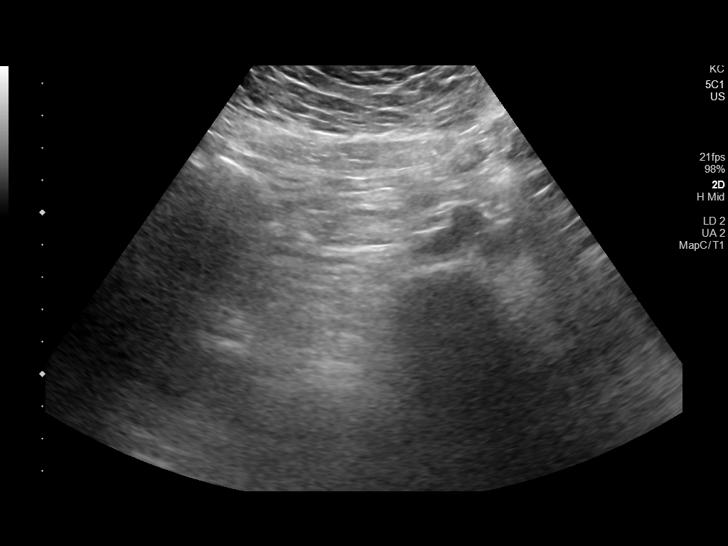

[13 of 25 positions shown; findings below may reference images not displayed]

FINDINGS: Gallbladder: Cholelithiasis measuring up to 6 mm. Mild gallbladder
wall thickening. No pericholecystic fluid. Adenomyomatosis of the
gallbladder wall. No sonographic Murphy sign noted by sonographer.

Common bile duct: Diameter: 6 mm

Liver: 1.3 cm cyst in the left lobe of the liver. Within normal
limits in parenchymal echogenicity. Portal vein is patent on color
Doppler imaging with normal direction of blood flow towards the
liver.

IVC: No abnormality visualized.

Pancreas: Visualized portion unremarkable.

Spleen: Size and appearance within normal limits.

Right Kidney: Length: 11.6 cm. Echogenicity within normal limits. No
mass or hydronephrosis visualized.

Left Kidney: Length: 11.7 cm. Echogenicity within normal limits.
cm lower pole renal cyst. No hydronephrosis visualized.

Abdominal aorta: No aneurysm visualized.

Other findings: None.
IMPRESSION: 1. Cholelithiasis with mild gallbladder wall thickening but without
pericholecystic fluid or sonographic Murphy sign. Findings which are
favored to reflect a chronic inflammatory changes, consider further
evaluation with nuclear medicine HIDA scan with ejection fraction if
clinically indicated.
2. Gallbladder adenomyomatosis.

## 2022-06-06 DIAGNOSIS — Z1339 Encounter for screening examination for other mental health and behavioral disorders: Secondary | ICD-10-CM | POA: Diagnosis not present

## 2022-06-06 DIAGNOSIS — I872 Venous insufficiency (chronic) (peripheral): Secondary | ICD-10-CM | POA: Diagnosis not present

## 2022-06-19 DIAGNOSIS — Z3040 Encounter for surveillance of contraceptives, unspecified: Secondary | ICD-10-CM | POA: Diagnosis not present

## 2022-06-19 DIAGNOSIS — N920 Excessive and frequent menstruation with regular cycle: Secondary | ICD-10-CM | POA: Diagnosis not present

## 2022-06-21 DIAGNOSIS — M79604 Pain in right leg: Secondary | ICD-10-CM | POA: Diagnosis not present

## 2022-06-21 DIAGNOSIS — M79605 Pain in left leg: Secondary | ICD-10-CM | POA: Diagnosis not present

## 2022-06-21 DIAGNOSIS — R600 Localized edema: Secondary | ICD-10-CM | POA: Diagnosis not present

## 2022-10-01 DIAGNOSIS — L259 Unspecified contact dermatitis, unspecified cause: Secondary | ICD-10-CM | POA: Diagnosis not present

## 2023-03-11 DIAGNOSIS — F418 Other specified anxiety disorders: Secondary | ICD-10-CM | POA: Diagnosis not present

## 2023-03-11 DIAGNOSIS — E785 Hyperlipidemia, unspecified: Secondary | ICD-10-CM | POA: Diagnosis not present

## 2023-03-11 DIAGNOSIS — D509 Iron deficiency anemia, unspecified: Secondary | ICD-10-CM | POA: Diagnosis not present

## 2023-03-11 DIAGNOSIS — R7989 Other specified abnormal findings of blood chemistry: Secondary | ICD-10-CM | POA: Diagnosis not present

## 2023-03-15 DIAGNOSIS — Z1339 Encounter for screening examination for other mental health and behavioral disorders: Secondary | ICD-10-CM | POA: Diagnosis not present

## 2023-03-15 DIAGNOSIS — D509 Iron deficiency anemia, unspecified: Secondary | ICD-10-CM | POA: Diagnosis not present

## 2023-03-15 DIAGNOSIS — E669 Obesity, unspecified: Secondary | ICD-10-CM | POA: Diagnosis not present

## 2023-03-15 DIAGNOSIS — E785 Hyperlipidemia, unspecified: Secondary | ICD-10-CM | POA: Diagnosis not present

## 2023-03-15 DIAGNOSIS — I872 Venous insufficiency (chronic) (peripheral): Secondary | ICD-10-CM | POA: Diagnosis not present

## 2023-03-15 DIAGNOSIS — Z0000 Encounter for general adult medical examination without abnormal findings: Secondary | ICD-10-CM | POA: Diagnosis not present

## 2023-03-15 DIAGNOSIS — Z1331 Encounter for screening for depression: Secondary | ICD-10-CM | POA: Diagnosis not present

## 2023-03-15 DIAGNOSIS — R82998 Other abnormal findings in urine: Secondary | ICD-10-CM | POA: Diagnosis not present

## 2023-03-22 ENCOUNTER — Other Ambulatory Visit: Payer: Self-pay | Admitting: *Deleted

## 2023-03-22 DIAGNOSIS — M79604 Pain in right leg: Secondary | ICD-10-CM

## 2023-04-02 ENCOUNTER — Ambulatory Visit (HOSPITAL_COMMUNITY)
Admission: RE | Admit: 2023-04-02 | Discharge: 2023-04-02 | Disposition: A | Discharge status: Home / Self Care | Payer: BC Managed Care – PPO | Source: Ambulatory Visit | Attending: Vascular Surgery | Admitting: Vascular Surgery

## 2023-04-02 DIAGNOSIS — M79604 Pain in right leg: Secondary | ICD-10-CM | POA: Diagnosis not present

## 2023-04-02 DIAGNOSIS — M79605 Pain in left leg: Secondary | ICD-10-CM | POA: Diagnosis not present

## 2023-04-08 ENCOUNTER — Ambulatory Visit: Payer: BC Managed Care – PPO | Admitting: Physician Assistant

## 2023-04-08 VITALS — BP 108/74 | HR 55 | Temp 98.2°F | Resp 20 | Ht 65.0 in | Wt 190.0 lb

## 2023-04-08 DIAGNOSIS — M7989 Other specified soft tissue disorders: Secondary | ICD-10-CM

## 2023-04-08 NOTE — Progress Notes (Signed)
[facility] [place] [facility]     History of Present Illness  Maria Duran is a 47 y.o. female who presents with chief complaint: swollen leg.  Patient notes, onset of swelling > 5 years ago.  She has no history of DVT or non healing ulcers.  She denies claudication and rest pain in B LE.  She states it happens more later in the day and in the summer months.  He right LE does not swell.    She does not wear compression or use elevation daily.  She works at a seated job daily.  She denies family history vein disease.        Past Medical History:  Diagnosis Date   Acne rosacea    Anemia    Anxiety    Asthma    Depression    Esophageal reflux    Internal hemorrhoids without mention of complication    Polycystic ovaries    Symptomatic cholelithiasis    Urticaria    Vestibular dysfunction    Mal de Debarquement syndrome     Past Surgical History:  Procedure Laterality Date   APPENDECTOMY     CHOLECYSTECTOMY N/A 08/29/2021   Procedure: LAPAROSCOPIC CHOLECYSTECTOMY WITH ICG DYE;  Surgeon: Emelia Loron, MD;  Location: [facility] OR;  Service: General;  Laterality: N/A;   COLONOSCOPY     ESOPHAGOGASTRODUODENOSCOPY     LAPAROSCOPIC APPENDECTOMY N/A 07/20/2019   Procedure: APPENDECTOMY LAPAROSCOPIC;  Surgeon: Emelia Loron, MD;  Location: [facility] OR;  Service: General;  Laterality: N/A;   UPPER GASTROINTESTINAL ENDOSCOPY      Social History   Socioeconomic History   Marital status: Single    Spouse name: Not on file   Number of children: Not on file   Years of education: Not on file   Highest education level: Not on file  Occupational History   Not on file  Tobacco Use   Smoking status: Never   Smokeless tobacco: Never  Vaping Use   Vaping Use: Never used  Substance and Sexual Activity   Alcohol use: Yes    Alcohol/week: 0.0 standard drinks of alcohol    Comment: occ   Drug use: No   Sexual activity: Not on file  Other Topics Concern   Not on file   Social History Narrative   Single no children   she is an Geophysicist/field seismologist to a Veterinary surgeon at [employer] and little   1 alcoholic beverage a day   No caffeine      Social Determinants of Corporate investment banker Strain: Not on file  Food Insecurity: Not on file  Transportation Needs: Not on file  Physical Activity: Not on file  Stress: Not on file  Social Connections: Not on file  Intimate Partner Violence: Not on file    Family History  Problem Relation Age of Onset   Hypertension Father    Hyperlipidemia Father    Alzheimer's disease Father    Migraines Brother    Crohn's disease Maternal Grandfather    Colon cancer Paternal Grandfather    Colon cancer Other        1st Degree Relative <60   Hyperlipidemia Other    Hypertension Other    Crohn's disease Other    Stomach cancer Neg Hx    Rectal cancer Neg Hx     Current Outpatient Medications on File Prior to Visit  Medication Sig Dispense Refill   Ascorbic Acid (VITAMIN C) 1000 MG tablet Take 1,000 mg by mouth  daily.     Cholecalciferol (VITAMIN D3) 5000 units CAPS Take 5,000 Units by mouth at bedtime.     clonazePAM (KLONOPIN) 0.5 MG tablet take 1 tablet by mouth every 8 hours if needed 270 tablet 1   Cyanocobalamin (VITAMIN B-12) 2500 MCG SUBL Take 2,500 mcg by mouth daily.     famotidine (PEPCID) 20 MG tablet Take 20 mg by mouth daily as needed for heartburn or indigestion.     furosemide (LASIX) 20 MG tablet Take 20 mg by mouth daily as needed for edema.     iron polysaccharides (NIFEREX) 150 MG capsule Take 150 mg by mouth daily.     MAGNESIUM PO Take 500 mg by mouth at bedtime.      metroNIDAZOLE (METROGEL) 0.75 % gel Apply 1 application topically every other day.     sertraline (ZOLOFT) 50 MG tablet Take 1 tablet (50 mg total) by mouth daily. 90 tablet 3   triamcinolone (NASACORT) 55 MCG/ACT AERO nasal inhaler Place 1 spray into the nose daily. 16.5 g 5   No current facility-administered medications on file prior to  visit.    Allergies as of 04/08/2023 - Review Complete 04/08/2023  Allergen Reaction Noted   Erythromycin ethylsuccinate Rash 04/03/2007     ROS:   General:  No weight loss, Fever, chills  HEENT: No recent headaches, no nasal bleeding, no visual changes, no sore throat  Neurologic: No dizziness, blackouts, seizures. No recent symptoms of stroke or mini- stroke. No recent episodes of slurred speech, or temporary blindness.  Cardiac: No recent episodes of chest pain/pressure, no shortness of breath at rest.  No shortness of breath with exertion.  Denies history of atrial fibrillation or irregular heartbeat  Vascular: No history of rest pain in feet.  No history of claudication.  No history of non-healing ulcer, No history of DVT   Pulmonary: No home oxygen, no productive cough, no hemoptysis,  No asthma or wheezing  Musculoskeletal:  [ ]  Arthritis, [ ]  Low back pain,  [ ]  Joint pain  Hematologic:No history of hypercoagulable state.  No history of easy bleeding.  No history of anemia  Gastrointestinal: No hematochezia or melena,  No gastroesophageal reflux, no trouble swallowing  Urinary: [ ]  chronic Kidney disease, [ ]  on HD - [ ]  MWF or [ ]  TTHS, [ ]  Burning with urination, [ ]  Frequent urination, [ ]  Difficulty urinating;   Skin: No rashes  Psychological: No history of anxiety,  No history of depression  Physical Examination  Vitals:   04/08/23 0835  BP: 108/74  Pulse: (!) 55  Resp: 20  Temp: 98.2 F (36.8 C)  SpO2: 95%  Weight: 190 lb (86.2 kg)  Height: 5\' 5"  (1.651 m)    Body mass index is 31.62 kg/m.  General:  Alert and oriented, no acute distress HEENT: Normal Neck: No bruit or JVD Pulmonary: Clear to auscultation bilaterally Cardiac: Regular Rate and Rhythm without murmur Abdomen: Soft, non-tender, non-distended, no mass, no scars Skin: No rash Extremity Pulses:  2+ radial, brachial, femoral, dorsalis pedis, pulses bilaterally Musculoskeletal: No  deformity or edema  Neurologic: Upper and lower extremity motor 5/5 and symmetric  DATA: +--------------+---------+------+-----------+------------+-------------+  LEFT         Reflux NoRefluxReflux TimeDiameter cmsComments                               Yes                                        +--------------+---------+------+-----------+------------+-------------+  CFV                    yes   >1 second                            +--------------+---------+------+-----------+------------+-------------+  FV mid        no                                                   +--------------+---------+------+-----------+------------+-------------+  Popliteal    no                                                   +--------------+---------+------+-----------+------------+-------------+  GSV at [facility]    no                            0.68                   +--------------+---------+------+-----------+------------+-------------+  GSV prox thighno                            0.46                   +--------------+---------+------+-----------+------------+-------------+  GSV mid thigh no                            0.39                   +--------------+---------+------+-----------+------------+-------------+  GSV dist thigh                                      out of fascia  +--------------+---------+------+-----------+------------+-------------+  SSV Pop Fossa no                            0.19                   +--------------+---------+------+-----------+------------+-------------+  SSV prox calf no                            0.16                   +--------------+---------+------+-----------+------------+-------------+       Summary:  Left:  - No evidence of deep vein thrombosis seen in the left lower extremity,  from the common femoral through the popliteal veins.  - No evidence of superficial venous thrombosis in  the left lower  extremity.  - The deep venous system is incompetent at the common femoral vein.  - The great saphneous vein is competent.  - The small saphenous vein is competent.      Assessment/Plan: Left LE unilateral edema She does not have varicose veins or swelling into the dorsum of the foot.  She has CFV reflux and no SFJ/GSV reflux on duplex today.   I explained and gave her our  vein handout.  Elevation when at rest, exercise, compression and healthy diet.  She is not at risk of limb loss with palpable pedal pulses B.   She will f/u PRN.     Mosetta Pigeon PA-C [facility] [place] Office: [phone]  MD in clinic [place]

## 2023-06-05 DIAGNOSIS — Z1231 Encounter for screening mammogram for malignant neoplasm of breast: Secondary | ICD-10-CM | POA: Diagnosis not present

## 2023-06-05 DIAGNOSIS — Z1151 Encounter for screening for human papillomavirus (HPV): Secondary | ICD-10-CM | POA: Diagnosis not present

## 2023-06-05 DIAGNOSIS — Z01419 Encounter for gynecological examination (general) (routine) without abnormal findings: Secondary | ICD-10-CM | POA: Diagnosis not present

## 2023-06-05 DIAGNOSIS — Z124 Encounter for screening for malignant neoplasm of cervix: Secondary | ICD-10-CM | POA: Diagnosis not present

## 2023-06-05 DIAGNOSIS — Z6830 Body mass index (BMI) 30.0-30.9, adult: Secondary | ICD-10-CM | POA: Diagnosis not present

## 2023-08-21 DIAGNOSIS — Z3041 Encounter for surveillance of contraceptive pills: Secondary | ICD-10-CM | POA: Diagnosis not present

## 2023-08-21 DIAGNOSIS — N920 Excessive and frequent menstruation with regular cycle: Secondary | ICD-10-CM | POA: Diagnosis not present

## 2023-10-21 DIAGNOSIS — J209 Acute bronchitis, unspecified: Secondary | ICD-10-CM | POA: Diagnosis not present

## 2023-10-21 DIAGNOSIS — J029 Acute pharyngitis, unspecified: Secondary | ICD-10-CM | POA: Diagnosis not present

## 2023-10-21 DIAGNOSIS — R058 Other specified cough: Secondary | ICD-10-CM | POA: Diagnosis not present

## 2023-11-26 DIAGNOSIS — Z803 Family history of malignant neoplasm of breast: Secondary | ICD-10-CM | POA: Diagnosis not present

## 2023-11-26 DIAGNOSIS — N644 Mastodynia: Secondary | ICD-10-CM | POA: Diagnosis not present

## 2024-03-18 DIAGNOSIS — E785 Hyperlipidemia, unspecified: Secondary | ICD-10-CM | POA: Diagnosis not present

## 2024-03-18 DIAGNOSIS — D509 Iron deficiency anemia, unspecified: Secondary | ICD-10-CM | POA: Diagnosis not present

## 2024-03-25 DIAGNOSIS — R82998 Other abnormal findings in urine: Secondary | ICD-10-CM | POA: Diagnosis not present

## 2024-03-25 DIAGNOSIS — Z1331 Encounter for screening for depression: Secondary | ICD-10-CM | POA: Diagnosis not present

## 2024-03-25 DIAGNOSIS — Z1339 Encounter for screening examination for other mental health and behavioral disorders: Secondary | ICD-10-CM | POA: Diagnosis not present

## 2024-03-25 DIAGNOSIS — D509 Iron deficiency anemia, unspecified: Secondary | ICD-10-CM | POA: Diagnosis not present

## 2024-03-25 DIAGNOSIS — Z0000 Encounter for general adult medical examination without abnormal findings: Secondary | ICD-10-CM | POA: Diagnosis not present

## 2024-06-09 DIAGNOSIS — Z1231 Encounter for screening mammogram for malignant neoplasm of breast: Secondary | ICD-10-CM | POA: Diagnosis not present

## 2024-06-09 DIAGNOSIS — Z6830 Body mass index (BMI) 30.0-30.9, adult: Secondary | ICD-10-CM | POA: Diagnosis not present

## 2024-06-09 DIAGNOSIS — Z01419 Encounter for gynecological examination (general) (routine) without abnormal findings: Secondary | ICD-10-CM | POA: Diagnosis not present

## 2024-06-17 DIAGNOSIS — L309 Dermatitis, unspecified: Secondary | ICD-10-CM | POA: Diagnosis not present

## 2024-06-17 DIAGNOSIS — L578 Other skin changes due to chronic exposure to nonionizing radiation: Secondary | ICD-10-CM | POA: Diagnosis not present

## 2024-06-17 DIAGNOSIS — L820 Inflamed seborrheic keratosis: Secondary | ICD-10-CM | POA: Diagnosis not present

## 2024-06-17 DIAGNOSIS — L821 Other seborrheic keratosis: Secondary | ICD-10-CM | POA: Diagnosis not present

## 2024-06-17 DIAGNOSIS — L719 Rosacea, unspecified: Secondary | ICD-10-CM | POA: Diagnosis not present

## 2024-09-02 DIAGNOSIS — F4321 Adjustment disorder with depressed mood: Secondary | ICD-10-CM | POA: Diagnosis not present

## 2024-09-23 DIAGNOSIS — F4321 Adjustment disorder with depressed mood: Secondary | ICD-10-CM | POA: Diagnosis not present
